# Patient Record
Sex: Female | Born: 1975 | Race: White | Hispanic: No | Marital: Married | Smoking: Never smoker
Health system: Southern US, Community
[De-identification: ages and names within clinical notes are randomized; demographics above are authoritative.]

## PROBLEM LIST (undated history)

## (undated) DIAGNOSIS — E785 Hyperlipidemia, unspecified: Secondary | ICD-10-CM

## (undated) DIAGNOSIS — D688 Other specified coagulation defects: Secondary | ICD-10-CM

## (undated) DIAGNOSIS — Z86718 Personal history of other venous thrombosis and embolism: Secondary | ICD-10-CM

## (undated) DIAGNOSIS — E039 Hypothyroidism, unspecified: Secondary | ICD-10-CM

## (undated) DIAGNOSIS — R002 Palpitations: Secondary | ICD-10-CM

## (undated) DIAGNOSIS — F419 Anxiety disorder, unspecified: Secondary | ICD-10-CM

## (undated) DIAGNOSIS — D6851 Activated protein C resistance: Secondary | ICD-10-CM

## (undated) DIAGNOSIS — R519 Headache, unspecified: Secondary | ICD-10-CM

## (undated) HISTORY — DX: Hypothyroidism, unspecified: E03.9

## (undated) HISTORY — DX: Headache, unspecified: R51.9

## (undated) HISTORY — DX: Hyperlipidemia, unspecified: E78.5

## (undated) HISTORY — DX: Anxiety disorder, unspecified: F41.9

## (undated) HISTORY — PX: LEG SURGERY: SHX1003

## (undated) HISTORY — PX: DILATION AND CURETTAGE OF UTERUS: SHX78

## (undated) HISTORY — DX: Palpitations: R00.2

## (undated) HISTORY — DX: Activated protein C resistance: D68.51

## (undated) HISTORY — DX: Personal history of other venous thrombosis and embolism: Z86.718

---

## 1998-08-28 ENCOUNTER — Other Ambulatory Visit: Admission: RE | Admit: 1998-08-28 | Discharge: 1998-08-28 | Payer: Self-pay | Admitting: Obstetrics and Gynecology

## 1999-09-03 ENCOUNTER — Other Ambulatory Visit: Admission: RE | Admit: 1999-09-03 | Discharge: 1999-09-03 | Payer: Self-pay | Admitting: Obstetrics and Gynecology

## 2000-01-02 ENCOUNTER — Other Ambulatory Visit: Admission: RE | Admit: 2000-01-02 | Discharge: 2000-01-02 | Payer: Self-pay | Admitting: Obstetrics and Gynecology

## 2000-02-26 ENCOUNTER — Encounter (INDEPENDENT_AMBULATORY_CARE_PROVIDER_SITE_OTHER): Payer: Self-pay | Admitting: Specialist

## 2000-02-26 ENCOUNTER — Other Ambulatory Visit: Admission: RE | Admit: 2000-02-26 | Discharge: 2000-02-26 | Payer: Self-pay | Admitting: Obstetrics and Gynecology

## 2000-07-29 ENCOUNTER — Emergency Department (HOSPITAL_COMMUNITY): Admission: EM | Admit: 2000-07-29 | Discharge: 2000-07-30 | Payer: Self-pay | Admitting: Emergency Medicine

## 2000-09-10 ENCOUNTER — Other Ambulatory Visit: Admission: RE | Admit: 2000-09-10 | Discharge: 2000-09-10 | Payer: Self-pay | Admitting: Obstetrics and Gynecology

## 2000-12-30 ENCOUNTER — Other Ambulatory Visit: Admission: RE | Admit: 2000-12-30 | Discharge: 2000-12-30 | Payer: Self-pay | Admitting: Obstetrics and Gynecology

## 2001-05-12 ENCOUNTER — Other Ambulatory Visit: Admission: RE | Admit: 2001-05-12 | Discharge: 2001-05-12 | Payer: Self-pay | Admitting: Obstetrics and Gynecology

## 2001-09-17 ENCOUNTER — Other Ambulatory Visit: Admission: RE | Admit: 2001-09-17 | Discharge: 2001-09-17 | Payer: Self-pay | Admitting: Obstetrics and Gynecology

## 2002-07-07 ENCOUNTER — Encounter: Admission: RE | Admit: 2002-07-07 | Discharge: 2002-07-07 | Payer: Self-pay | Admitting: Obstetrics and Gynecology

## 2002-07-07 ENCOUNTER — Encounter: Payer: Self-pay | Admitting: Obstetrics and Gynecology

## 2002-07-28 ENCOUNTER — Encounter: Payer: Self-pay | Admitting: Obstetrics and Gynecology

## 2002-07-28 ENCOUNTER — Encounter: Admission: RE | Admit: 2002-07-28 | Discharge: 2002-07-28 | Payer: Self-pay | Admitting: Obstetrics and Gynecology

## 2002-10-06 ENCOUNTER — Other Ambulatory Visit: Admission: RE | Admit: 2002-10-06 | Discharge: 2002-10-06 | Payer: Self-pay | Admitting: Obstetrics and Gynecology

## 2003-05-08 ENCOUNTER — Inpatient Hospital Stay (HOSPITAL_COMMUNITY): Admission: AD | Admit: 2003-05-08 | Discharge: 2003-05-08 | Payer: Self-pay | Admitting: Obstetrics and Gynecology

## 2003-05-08 ENCOUNTER — Encounter: Payer: Self-pay | Admitting: *Deleted

## 2003-05-10 ENCOUNTER — Inpatient Hospital Stay (HOSPITAL_COMMUNITY): Admission: AD | Admit: 2003-05-10 | Discharge: 2003-05-13 | Payer: Self-pay | Admitting: Obstetrics and Gynecology

## 2003-05-11 ENCOUNTER — Encounter (INDEPENDENT_AMBULATORY_CARE_PROVIDER_SITE_OTHER): Payer: Self-pay | Admitting: *Deleted

## 2003-06-21 ENCOUNTER — Other Ambulatory Visit: Admission: RE | Admit: 2003-06-21 | Discharge: 2003-06-21 | Payer: Self-pay | Admitting: Obstetrics and Gynecology

## 2006-01-22 ENCOUNTER — Ambulatory Visit (HOSPITAL_COMMUNITY): Admission: RE | Admit: 2006-01-22 | Discharge: 2006-01-22 | Payer: Self-pay | Admitting: Obstetrics and Gynecology

## 2006-01-22 ENCOUNTER — Encounter (INDEPENDENT_AMBULATORY_CARE_PROVIDER_SITE_OTHER): Payer: Self-pay | Admitting: Specialist

## 2006-10-06 ENCOUNTER — Ambulatory Visit (HOSPITAL_COMMUNITY): Admission: RE | Admit: 2006-10-06 | Discharge: 2006-10-06 | Payer: Self-pay | Admitting: Obstetrics and Gynecology

## 2006-10-08 ENCOUNTER — Ambulatory Visit: Payer: Self-pay | Admitting: Oncology

## 2006-10-09 ENCOUNTER — Ambulatory Visit: Payer: Self-pay | Admitting: Oncology

## 2006-10-14 LAB — CBC WITH DIFFERENTIAL (CANCER CENTER ONLY)
BASO#: 0 10*3/uL (ref 0.0–0.2)
BASO%: 0.5 % (ref 0.0–2.0)
EOS%: 1.8 % (ref 0.0–7.0)
HCT: 36.8 % (ref 34.8–46.6)
HGB: 12.7 g/dL (ref 11.6–15.9)
LYMPH#: 2.6 10*3/uL (ref 0.9–3.3)
LYMPH%: 36.9 % (ref 14.0–48.0)
MCH: 29.5 pg (ref 26.0–34.0)
MCHC: 34.4 g/dL (ref 32.0–36.0)
MCV: 86 fL (ref 81–101)
MONO%: 5 % (ref 0.0–13.0)
NEUT%: 55.8 % (ref 39.6–80.0)
RDW: 11.7 % (ref 10.5–14.6)

## 2006-10-21 LAB — COMPREHENSIVE METABOLIC PANEL
ALT: 15 U/L (ref 0–35)
AST: 20 U/L (ref 0–37)
Alkaline Phosphatase: 55 U/L (ref 39–117)
Creatinine, Ser: 0.78 mg/dL (ref 0.40–1.20)
Total Bilirubin: 0.7 mg/dL (ref 0.3–1.2)

## 2006-10-21 LAB — HYPERCOAGULABLE PANEL, COMPREHENSIVE
Anticardiolipin IgA: <7 [APL'U] (ref <13)
Anticardiolipin IgM: 14 [MPL'U] (ref <10)
Beta-2-Glycoprotein I IgM: <4 U/mL (ref <10)
DRVVT: 41.3 secs (ref 36.1–47.0)
Protein C, Total: 95 % (ref 70–140)

## 2006-10-21 LAB — LACTATE DEHYDROGENASE: LDH: 115 U/L (ref 94–250)

## 2006-11-26 ENCOUNTER — Ambulatory Visit (HOSPITAL_COMMUNITY): Admission: RE | Admit: 2006-11-26 | Discharge: 2006-11-26 | Payer: Self-pay | Admitting: Obstetrics and Gynecology

## 2006-12-18 ENCOUNTER — Ambulatory Visit: Payer: Self-pay | Admitting: Oncology

## 2006-12-30 LAB — CARDIOLIPIN ANTIBODIES, IGG, IGM, IGA
Anticardiolipin IgG: <7 [GPL'U] (ref <11)
Anticardiolipin IgM: 7 [MPL'U] (ref <10)

## 2006-12-30 LAB — BETA-2 GLYCOPROTEIN ANTIBODIES: Beta-2 Glyco I IgG: <4 U/mL (ref <20)

## 2009-01-03 ENCOUNTER — Ambulatory Visit: Payer: Self-pay | Admitting: Oncology

## 2009-02-02 ENCOUNTER — Ambulatory Visit: Payer: Self-pay | Admitting: Oncology

## 2009-02-06 LAB — COMPREHENSIVE METABOLIC PANEL
AST: 16 U/L (ref 0–37)
Alkaline Phosphatase: 61 U/L (ref 39–117)
BUN: 7 mg/dL (ref 6–23)
Glucose, Bld: 90 mg/dL (ref 70–99)
Total Bilirubin: 0.3 mg/dL (ref 0.3–1.2)

## 2009-02-06 LAB — PROTIME-INR (CHCC SATELLITE)
INR: 0.9 — ABNORMAL LOW (ref 2.0–3.5)
Protime: 10.8 Seconds (ref 10.6–13.4)

## 2009-02-06 LAB — IRON AND TIBC
%SAT: 19 % — ABNORMAL LOW (ref 20–55)
Iron: 89 ug/dL (ref 42–145)
TIBC: 468 ug/dL (ref 250–470)
UIBC: 379 ug/dL

## 2009-02-06 LAB — CBC WITH DIFFERENTIAL (CANCER CENTER ONLY)
BASO#: 0 10*3/uL (ref 0.0–0.2)
BASO%: 0.5 % (ref 0.0–2.0)
HCT: 27.6 % — ABNORMAL LOW (ref 34.8–46.6)
HGB: 9.8 g/dL — ABNORMAL LOW (ref 11.6–15.9)
LYMPH%: 25.1 % (ref 14.0–48.0)
MCV: 84 fL (ref 81–101)
MONO#: 0.5 10*3/uL (ref 0.1–0.9)
NEUT%: 66.2 % (ref 39.6–80.0)
RDW: 12.4 % (ref 10.5–14.6)
WBC: 7.4 10*3/uL (ref 3.9–10.0)

## 2009-02-06 LAB — HEPARIN ANTI-XA: Heparin LMW: 0.41 IU/mL

## 2009-03-24 ENCOUNTER — Ambulatory Visit: Payer: Self-pay | Admitting: Oncology

## 2009-03-29 LAB — CBC WITH DIFFERENTIAL (CANCER CENTER ONLY)
Eosinophils Absolute: 0.2 10*3/uL (ref 0.0–0.5)
HCT: 33.2 % — ABNORMAL LOW (ref 34.8–46.6)
LYMPH#: 2.2 10*3/uL (ref 0.9–3.3)
LYMPH%: 29.3 % (ref 14.0–48.0)
MCV: 89 fL (ref 81–101)
MONO#: 0.4 10*3/uL (ref 0.1–0.9)
NEUT%: 61.6 % (ref 39.6–80.0)
Platelets: 213 10*3/uL (ref 145–400)
RBC: 3.75 10*6/uL (ref 3.70–5.32)
WBC: 7.6 10*3/uL (ref 3.9–10.0)

## 2009-03-30 LAB — APTT: aPTT: 31 seconds (ref 24–37)

## 2009-04-12 ENCOUNTER — Inpatient Hospital Stay (HOSPITAL_COMMUNITY): Admission: AD | Admit: 2009-04-12 | Discharge: 2009-04-15 | Payer: Self-pay | Admitting: Obstetrics and Gynecology

## 2009-04-26 ENCOUNTER — Ambulatory Visit: Payer: Self-pay | Admitting: Oncology

## 2009-04-27 LAB — CBC WITH DIFFERENTIAL (CANCER CENTER ONLY)
Eosinophils Absolute: 0.2 10*3/uL (ref 0.0–0.5)
MONO#: 0.4 10*3/uL (ref 0.1–0.9)
NEUT#: 4.9 10*3/uL (ref 1.5–6.5)
Platelets: 311 10*3/uL (ref 145–400)
RBC: 4.47 10*6/uL (ref 3.70–5.32)
WBC: 8.9 10*3/uL (ref 3.9–10.0)

## 2009-06-23 ENCOUNTER — Ambulatory Visit: Payer: Self-pay | Admitting: Oncology

## 2009-06-28 LAB — CBC WITH DIFFERENTIAL (CANCER CENTER ONLY)
BASO%: 0.5 % (ref 0.0–2.0)
EOS%: 3.5 % (ref 0.0–7.0)
MCH: 29.1 pg (ref 26.0–34.0)
MCHC: 33.4 g/dL (ref 32.0–36.0)
MONO%: 5.3 % (ref 0.0–13.0)
NEUT#: 4.5 10*3/uL (ref 1.5–6.5)
Platelets: 264 10*3/uL (ref 145–400)
RBC: 4.39 10*6/uL (ref 3.70–5.32)

## 2009-06-28 LAB — HEPARIN ANTI-XA: Heparin LMW: 0.08 IU/mL

## 2010-03-30 ENCOUNTER — Encounter: Admission: RE | Admit: 2010-03-30 | Discharge: 2010-03-30 | Payer: Self-pay | Admitting: Family Medicine

## 2010-10-18 LAB — CBC
Hemoglobin: 10.8 g/dL — ABNORMAL LOW (ref 12.0–15.0)
MCHC: 34.1 g/dL (ref 30.0–36.0)
MCV: 90.9 fL (ref 78.0–100.0)
RBC: 3.49 MIL/uL — ABNORMAL LOW (ref 3.87–5.11)
RDW: 13.5 % (ref 11.5–15.5)

## 2010-10-18 LAB — CCBB MATERNAL DONOR DRAW

## 2010-10-19 LAB — CBC
HCT: 36.5 % (ref 36.0–46.0)
MCHC: 33.9 g/dL (ref 30.0–36.0)
MCHC: 34.7 g/dL (ref 30.0–36.0)
MCV: 89.5 fL (ref 78.0–100.0)
MCV: 89.8 fL (ref 78.0–100.0)
Platelets: 181 10*3/uL (ref 150–400)
Platelets: 190 10*3/uL (ref 150–400)
Platelets: 197 10*3/uL (ref 150–400)
RBC: 3.89 MIL/uL (ref 3.87–5.11)
RDW: 13.4 % (ref 11.5–15.5)
RDW: 13.9 % (ref 11.5–15.5)
WBC: 10.2 10*3/uL (ref 4.0–10.5)
WBC: 10.9 10*3/uL — ABNORMAL HIGH (ref 4.0–10.5)

## 2010-10-19 LAB — RPR: RPR Ser Ql: NONREACTIVE

## 2010-10-19 LAB — APTT: aPTT: 26 seconds (ref 24–37)

## 2010-11-30 NOTE — Op Note (Signed)
Samantha Hancock, Samantha Hancock              ACCOUNT NO.:  [id number]   MEDICAL RECORD NO.:  [id number]          PATIENT TYPE:  AMB   LOCATION:  SDC                           FACILITY:  [facility]   PHYSICIAN:  Maxie Better, M.D.DATE OF BIRTH:  [birth year]   DATE OF PROCEDURE:  01/22/2006  DATE OF DISCHARGE:                                 OPERATIVE REPORT   PREOPERATIVE DIAGNOSIS:  Missed abortion, vaginal bleeding.   POSTOPERATIVE DIAGNOSIS:  Missed abortion, vaginal bleeding.   PROCEDURE:  Suction dilation and evacuation.   ANESTHESIA:  MAC and paracervical block.   SURGEON:  Maxie Better, M.D.   DESCRIPTION OF PROCEDURE:  Under adequate monitored anesthesia, the patient  was placed in the dorsal lithotomy position.  She was sterilely prepped and  draped in the usual fashion.  The bladder was catheterized for a moderate  amount of urine.  The patient had been examined in the office today with a  uterus that was retroverted and no adnexal masses that could be appreciated.  A bivalve speculum was placed in the vagina.  20 mL of 1% Nesacaine was  injected paracervical at 3 and 9 o'clock.  The anterior lip of the cervix  was grasped with a single-tooth tenaculum.  The cervix easily accepted a #25  Pratt dilator.  A #7 mm curved suction cannula was introduced into the  uterine cavity.  Products of conception was obtained.  The cavity was  curetted and suctioned. When all tissue was felt to be removed, all  instruments were then removed from the vagina.  Specimen labeled products of  conception was sent to pathology.  Estimated blood loss was minimal.  Complication was none.  The patient tolerated the procedure well and was  transferred to the recovery room in stable condition.      Maxie Better, M.D.  Electronically Signed     [place]/MEDQ  D:  01/22/2006  T:  01/22/2006  Job:  [id number]

## 2011-01-31 ENCOUNTER — Emergency Department (HOSPITAL_COMMUNITY)
Admission: EM | Admit: 2011-01-31 | Discharge: 2011-02-01 | Disposition: A | Discharge status: Home / Self Care | Payer: BC Managed Care – PPO | Attending: Emergency Medicine | Admitting: Emergency Medicine

## 2011-01-31 DIAGNOSIS — E039 Hypothyroidism, unspecified: Secondary | ICD-10-CM | POA: Insufficient documentation

## 2011-01-31 DIAGNOSIS — D6859 Other primary thrombophilia: Secondary | ICD-10-CM | POA: Insufficient documentation

## 2011-01-31 DIAGNOSIS — R002 Palpitations: Secondary | ICD-10-CM | POA: Insufficient documentation

## 2011-01-31 DIAGNOSIS — R059 Cough, unspecified: Secondary | ICD-10-CM | POA: Insufficient documentation

## 2011-01-31 DIAGNOSIS — Z79899 Other long term (current) drug therapy: Secondary | ICD-10-CM | POA: Insufficient documentation

## 2011-01-31 DIAGNOSIS — R05 Cough: Secondary | ICD-10-CM | POA: Insufficient documentation

## 2011-02-01 ENCOUNTER — Emergency Department (HOSPITAL_COMMUNITY): Payer: BC Managed Care – PPO

## 2011-02-01 LAB — DIFFERENTIAL
Basophils Absolute: 0.1 10*3/uL (ref 0.0–0.1)
Eosinophils Relative: 3 % (ref 0–5)
Lymphocytes Relative: 52 % — ABNORMAL HIGH (ref 12–46)
Lymphs Abs: 3.6 10*3/uL (ref 0.7–4.0)
Monocytes Relative: 7 % (ref 3–12)
Neutrophils Relative %: 36 % — ABNORMAL LOW (ref 43–77)

## 2011-02-01 LAB — COMPREHENSIVE METABOLIC PANEL
ALT: 18 U/L (ref 0–35)
Albumin: 3.3 g/dL — ABNORMAL LOW (ref 3.5–5.2)
BUN: 11 mg/dL (ref 6–23)
Calcium: 9.6 mg/dL (ref 8.4–10.5)
GFR calc Af Amer: >60 mL/min (ref >60)
Glucose, Bld: 95 mg/dL (ref 70–99)
Potassium: 3.6 mEq/L (ref 3.5–5.1)
Sodium: 139 mEq/L (ref 135–145)
Total Protein: 6.8 g/dL (ref 6.0–8.3)

## 2011-02-01 LAB — CBC
HCT: 32.8 % — ABNORMAL LOW (ref 36.0–46.0)
Hemoglobin: 11.5 g/dL — ABNORMAL LOW (ref 12.0–15.0)
MCHC: 35.1 g/dL (ref 30.0–36.0)
RBC: 4.11 MIL/uL (ref 3.87–5.11)
WBC: 6.9 10*3/uL (ref 4.0–10.5)

## 2011-02-01 LAB — CK TOTAL AND CKMB (NOT AT ARMC)
CK, MB: 1.8 ng/mL (ref 0.3–4.0)
Relative Index: INVALID (ref 0.0–2.5)
Total CK: 71 U/L (ref 7–177)

## 2015-08-12 ENCOUNTER — Encounter (HOSPITAL_BASED_OUTPATIENT_CLINIC_OR_DEPARTMENT_OTHER): Payer: Self-pay | Admitting: *Deleted

## 2015-08-12 ENCOUNTER — Emergency Department (HOSPITAL_BASED_OUTPATIENT_CLINIC_OR_DEPARTMENT_OTHER)
Admission: EM | Admit: 2015-08-12 | Discharge: 2015-08-12 | Disposition: A | Discharge status: Home / Self Care | Payer: 59 | Attending: Emergency Medicine | Admitting: Emergency Medicine

## 2015-08-12 DIAGNOSIS — Z79899 Other long term (current) drug therapy: Secondary | ICD-10-CM | POA: Insufficient documentation

## 2015-08-12 DIAGNOSIS — Z880 Allergy status to penicillin: Secondary | ICD-10-CM | POA: Insufficient documentation

## 2015-08-12 DIAGNOSIS — K1120 Sialoadenitis, unspecified: Secondary | ICD-10-CM | POA: Diagnosis not present

## 2015-08-12 DIAGNOSIS — E039 Hypothyroidism, unspecified: Secondary | ICD-10-CM | POA: Insufficient documentation

## 2015-08-12 DIAGNOSIS — R220 Localized swelling, mass and lump, head: Secondary | ICD-10-CM | POA: Diagnosis present

## 2015-08-12 DIAGNOSIS — Z862 Personal history of diseases of the blood and blood-forming organs and certain disorders involving the immune mechanism: Secondary | ICD-10-CM | POA: Diagnosis not present

## 2015-08-12 HISTORY — DX: Other specified coagulation defects: D68.8

## 2015-08-12 HISTORY — DX: Hypothyroidism, unspecified: E03.9

## 2015-08-12 LAB — CBC WITH DIFFERENTIAL/PLATELET
BASOS ABS: 0 10*3/uL (ref 0.0–0.1)
BASOS PCT: 0 %
EOS ABS: 0.1 10*3/uL (ref 0.0–0.7)
Eosinophils Relative: 2 %
HEMATOCRIT: 35.5 % — AB (ref 36.0–46.0)
HEMOGLOBIN: 11.9 g/dL — AB (ref 12.0–15.0)
Lymphocytes Relative: 26 %
Lymphs Abs: 1.9 10*3/uL (ref 0.7–4.0)
MCH: 28.6 pg (ref 26.0–34.0)
MCHC: 33.5 g/dL (ref 30.0–36.0)
MCV: 85.3 fL (ref 78.0–100.0)
Monocytes Absolute: 1 10*3/uL (ref 0.1–1.0)
Monocytes Relative: 14 %
NEUTROS ABS: 4.1 10*3/uL (ref 1.7–7.7)
NEUTROS PCT: 58 %
Platelets: 185 10*3/uL (ref 150–400)
RBC: 4.16 MIL/uL (ref 3.87–5.11)
RDW: 12.1 % (ref 11.5–15.5)
WBC: 7.2 10*3/uL (ref 4.0–10.5)

## 2015-08-12 LAB — BASIC METABOLIC PANEL
ANION GAP: 7 (ref 5–15)
BUN: 17 mg/dL (ref 6–20)
CALCIUM: 8.9 mg/dL (ref 8.9–10.3)
CO2: 27 mmol/L (ref 22–32)
CREATININE: 0.91 mg/dL (ref 0.44–1.00)
Chloride: 105 mmol/L (ref 101–111)
Glucose, Bld: 109 mg/dL — ABNORMAL HIGH (ref 65–99)
Potassium: 3.7 mmol/L (ref 3.5–5.1)
SODIUM: 139 mmol/L (ref 135–145)

## 2015-08-12 MED ORDER — AMOXICILLIN-POT CLAVULANATE 875-125 MG PO TABS: 1.0000 | ORAL_TABLET | Freq: Two times a day (BID) | ORAL | Status: DC

## 2015-08-12 MED ORDER — CLINDAMYCIN PHOSPHATE 600 MG/50ML IV SOLN
600.0000 mg | Freq: Once | INTRAVENOUS | Status: AC
  Administered 2015-08-12: 600 mg via INTRAVENOUS
  Filled 2015-08-12: qty 50

## 2015-08-12 NOTE — Discharge Instructions (Signed)
Please take your antibiotics as prescribed. Do not save or share them. Discontinue your other antibiotics. Follow-up with your doctor in 2 days for a wound recheck. Return to ED for any new or worsening symptoms as we discussed.  Parotitis Parotitis is soreness and inflammation of one or both parotid glands. The parotid glands produce saliva. They are located on each side of the face, below and in front of the earlobes. The saliva produced comes out of tiny openings (ducts) inside the cheeks. In most cases, parotitis goes away over time or with treatment. If your parotitis is caused by certain long-term (chronic) diseases, it may come back again.  CAUSES  Parotitis can be caused by:  Viral infections. Mumps is one viral infection that can cause parotitis.  Bacterial infections.  Blockage of the salivary ducts due to a salivary stone.  Narrowing of the salivary ducts.  Swelling of the salivary ducts.  Dehydration.  Autoimmune conditions, such as sarcoidosis or Sjogren syndrome.  Air from activities such as scuba diving, glass blowing, or playing an instrument (rare).  Human immunodeficiency virus (HIV) or acquired immunodeficiency syndrome (AIDS).  Tuberculosis. SIGNS AND SYMPTOMS   The ears may appear to be pushed up and out from their normal position.  Redness (erythema) of the skin over the parotid glands.  Pain and tenderness over the parotid glands.  Swelling in the parotid gland area.  Yellowish-white fluid (pus) coming from the ducts inside the cheeks.  Dry mouth.  Bad taste in the mouth. DIAGNOSIS  Your health care provider may determine that you have parotitis based on your symptoms and a physical exam. A sample of fluid may also be taken from the parotid gland and tested to find the cause of your infection. X-rays or computed tomography (CT) scans may be taken if your health care provider thinks you might have a salivary stone blocking your salivary duct. TREATMENT   Treatment varies depending upon the cause of your parotitis. If your parotitis is caused by mumps, no treatment is needed. The condition will go away on its own after 7 to 10 days. In other cases, treatment may include:  Antibiotic medicine if your infection was caused by bacteria.  Pain medicines.  Gland massage.  Eating sour candy to increase your saliva production.  Removal of salivary stones. Your health care provider may flush stones out with fluids or remove them with tweezers.  Surgery to remove the parotid glands. HOME CARE INSTRUCTIONS   If you were prescribed an antibiotic medicine, finish it all even if you start to feel better.  Put warm compresses on the sore area.  Take medicines only as directed by your health care provider.  Drink enough fluids to keep your urine clear or pale yellow. SEEK IMMEDIATE MEDICAL CARE IF:   You have increasing pain or swelling that is not controlled with medicine.  You have a fever. MAKE SURE YOU:  Understand these instructions.  Will watch your condition.  Will get help right away if you are not doing well or get worse.   This information is not intended to replace advice given to you by your health care provider. Make sure you discuss any questions you have with your health care provider.   Document Released: 12/21/2001 Document Revised: 07/22/2014 Document Reviewed: 11/24/2014 Elsevier Interactive Patient Education [employer].

## 2015-08-12 NOTE — ED Notes (Signed)
MD at bedside. 

## 2015-08-12 NOTE — ED Provider Notes (Signed)
CSN: [id number]     Arrival date & time 08/12/15  1717 History   First MD Initiated Contact with Patient 08/12/15 1750     Chief Complaint  Patient presents with  . Facial Swelling     (Consider location/radiation/quality/duration/timing/severity/associated sxs/prior Treatment) HPI Samantha Hancock is a 40 y.o. female who comes in for evaluation of facial swelling. Patient reports she has had an area on the left side of her cheek that has been increasingly red, swollen and somewhat tender. She was evaluated by her PCPs office 2 days ago and told it wasn't infected salivary gland and started on clindamycin. She has had 4 doses of her 400 mg clindamycin since that time. She is concerned because she feels her symptoms may be worsening. She was evaluated her PCPs office again today and referred to the ED for further evaluation. She denies any fevers, chills, sore throat, changes in voice, ear pain, difficulties opening her jaw or chewing.  Past Medical History  Diagnosis Date  . 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 Hypothyroid    Past Surgical History  Procedure Laterality Date  . Dilation and curettage of uterus    . Leg surgery      left leg   History reviewed. No pertinent family history. Social History  Substance Use Topics  . Smoking status: Never Smoker   . Smokeless tobacco: None  . Alcohol Use: Yes   OB History    No data available     Review of Systems A 10 point review of systems was completed and was negative except for pertinent positives and negatives as mentioned in the history of present illness     Allergies  Amoxil  Home Medications   Prior to Admission medications   Medication Sig Start Date End Date Taking? Authorizing Provider  levothyroxine (SYNTHROID, LEVOTHROID) 88 MCG tablet Take 88 mcg by mouth daily before breakfast.   Yes Historical Provider, MD  sertraline (ZOLOFT) 25 MG tablet Take 25 mg by mouth daily.   Yes  Historical Provider, MD  amoxicillin-clavulanate (AUGMENTIN) 875-125 MG tablet Take 1 tablet by mouth every 12 (twelve) hours. 08/12/15   Nuvia Hileman, PA-C   BP 120/86 mmHg  Pulse 64  Temp(Src) 98.5 F (36.9 C) (Oral)  Resp 16  Ht 5' 7.5" (1.715 m)  Wt 61.689 kg  BMI 20.97 kg/m2  SpO2 100%  LMP 08/06/2015 Physical Exam  Constitutional: She is oriented to person, place, and time. She appears well-developed and well-nourished.  HENT:  Head: Normocephalic and atraumatic.  Mouth/Throat: Oropharynx is clear and moist.  There is area of erythema and tenderness located in the area of the left parotid. There is surrounding localized lymphadenitis. No auricular lymphadenopathy or mastoid tenderness. No trismus or oral lesions.  Eyes: Conjunctivae are normal. Pupils are equal, round, and reactive to light. Right eye exhibits no discharge. Left eye exhibits no discharge. No scleral icterus.  Neck: Neck supple.  Cardiovascular: Normal rate, regular rhythm and normal heart sounds.   Pulmonary/Chest: Effort normal and breath sounds normal. No respiratory distress. She has no wheezes. She has no rales.  Abdominal: Soft. There is no tenderness.  Musculoskeletal: She exhibits no tenderness.  Neurological: She is alert and oriented to person, place, and time.  Cranial Nerves II-XII grossly intact  Skin: Skin is warm and dry. No rash noted.  Psychiatric: She has a normal mood and affect.  Nursing note and vitals reviewed.   ED Course  Procedures (  including critical care time) Labs Review Labs Reviewed  BASIC METABOLIC PANEL - Abnormal; Notable for the following:    Glucose, Bld 109 (*)    All other components within normal limits  CBC WITH DIFFERENTIAL/PLATELET - Abnormal; Notable for the following:    Hemoglobin 11.9 (*)    HCT 35.5 (*)    All other components within normal limits    Imaging Review No results found. I have personally reviewed and evaluated these images and lab  results as part of my medical decision-making.   EKG Interpretation None     Meds given in ED:  Medications  clindamycin (CLEOCIN) IVPB 600 mg (0 mg Intravenous Stopped 08/12/15 1854)    Discharge Medication List as of 08/12/2015  7:34 PM    START taking these medications   Details  amoxicillin-clavulanate (AUGMENTIN) 875-125 MG tablet Take 1 tablet by mouth every 12 (twelve) hours., Starting 08/12/2015, Until Discontinued, Print       Filed Vitals:   08/12/15 1721 08/12/15 1941  BP: 114/71 120/86  Pulse: 84 64  Temp: 98.5 F (36.9 C)   TempSrc: Oral   Resp: 16 16  Height: 5' 7.5" (1.715 m)   Weight: 61.689 kg   SpO2: 100% 100%    MDM  Samantha Hancock is a 40 y.o. female presents for evaluation of left-sided cheek pain, swelling, redness. Exam is consistent with parotitis. No evidence of PTA, Ludwig angina or other deep space infection at this time. However, we'll obtain basic labs, given IV dose of clindamycin in the ED. labs are unremarkable, no leukocytosis.  Overall, patient appears well, nontoxic, hemodynamically stable and afebrile. She is maintaining her airway without difficulty with normal speech. Labs are grossly unremarkable, no leukocytosis. Will plan on changing patient's antibiotic therapy to Augmentin. Discussed she will need a recheck in 2 days. Verbalizes understanding and agrees with this plan. Prior to patient discharge, I discussed and reviewed this case with Dr. Canary Brim    Final diagnoses:  Parotitis        Comer Locket, PA-C 08/13/15 [place], MD 08/16/15 [phone]

## 2015-08-12 NOTE — ED Notes (Signed)
Patient is alert and oriented x4.  She is complaining of facial swelling that started 2 days ago.   Patient has been seen by her PCP and was put on antibiotics.  Today she is complaining of  A different feeling in her left facial area with more warmth in her face.  She denies pain but  Does have some itching.

## 2016-08-11 DIAGNOSIS — Z23 Encounter for immunization: Secondary | ICD-10-CM | POA: Diagnosis not present

## 2016-08-28 DIAGNOSIS — D1801 Hemangioma of skin and subcutaneous tissue: Secondary | ICD-10-CM | POA: Diagnosis not present

## 2016-08-28 DIAGNOSIS — D2272 Melanocytic nevi of left lower limb, including hip: Secondary | ICD-10-CM | POA: Diagnosis not present

## 2016-08-28 DIAGNOSIS — D2271 Melanocytic nevi of right lower limb, including hip: Secondary | ICD-10-CM | POA: Diagnosis not present

## 2016-08-28 DIAGNOSIS — D225 Melanocytic nevi of trunk: Secondary | ICD-10-CM | POA: Diagnosis not present

## 2016-08-28 DIAGNOSIS — D485 Neoplasm of uncertain behavior of skin: Secondary | ICD-10-CM | POA: Diagnosis not present

## 2016-09-04 DIAGNOSIS — Z6823 Body mass index (BMI) 23.0-23.9, adult: Secondary | ICD-10-CM | POA: Diagnosis not present

## 2016-09-04 DIAGNOSIS — Z01419 Encounter for gynecological examination (general) (routine) without abnormal findings: Secondary | ICD-10-CM | POA: Diagnosis not present

## 2016-11-05 DIAGNOSIS — E039 Hypothyroidism, unspecified: Secondary | ICD-10-CM | POA: Diagnosis not present

## 2016-11-05 DIAGNOSIS — E559 Vitamin D deficiency, unspecified: Secondary | ICD-10-CM | POA: Diagnosis not present

## 2016-11-05 DIAGNOSIS — Z0000 Encounter for general adult medical examination without abnormal findings: Secondary | ICD-10-CM | POA: Diagnosis not present

## 2016-11-12 DIAGNOSIS — D6851 Activated protein C resistance: Secondary | ICD-10-CM | POA: Diagnosis not present

## 2016-11-12 DIAGNOSIS — E039 Hypothyroidism, unspecified: Secondary | ICD-10-CM | POA: Diagnosis not present

## 2016-11-12 DIAGNOSIS — Z0000 Encounter for general adult medical examination without abnormal findings: Secondary | ICD-10-CM | POA: Diagnosis not present

## 2016-12-23 DIAGNOSIS — E039 Hypothyroidism, unspecified: Secondary | ICD-10-CM | POA: Diagnosis not present

## 2017-09-16 DIAGNOSIS — Z01419 Encounter for gynecological examination (general) (routine) without abnormal findings: Secondary | ICD-10-CM | POA: Diagnosis not present

## 2017-09-16 DIAGNOSIS — Z6824 Body mass index (BMI) 24.0-24.9, adult: Secondary | ICD-10-CM | POA: Diagnosis not present

## 2017-11-14 DIAGNOSIS — E039 Hypothyroidism, unspecified: Secondary | ICD-10-CM | POA: Diagnosis not present

## 2017-11-14 DIAGNOSIS — Z0000 Encounter for general adult medical examination without abnormal findings: Secondary | ICD-10-CM | POA: Diagnosis not present

## 2017-11-25 DIAGNOSIS — Z0000 Encounter for general adult medical examination without abnormal findings: Secondary | ICD-10-CM | POA: Diagnosis not present

## 2017-11-25 DIAGNOSIS — D6851 Activated protein C resistance: Secondary | ICD-10-CM | POA: Diagnosis not present

## 2017-11-25 DIAGNOSIS — E039 Hypothyroidism, unspecified: Secondary | ICD-10-CM | POA: Diagnosis not present

## 2018-06-04 DIAGNOSIS — Z800 Family history of malignant neoplasm of digestive organs: Secondary | ICD-10-CM | POA: Diagnosis not present

## 2018-06-04 DIAGNOSIS — Z23 Encounter for immunization: Secondary | ICD-10-CM | POA: Diagnosis not present

## 2018-06-04 DIAGNOSIS — R002 Palpitations: Secondary | ICD-10-CM | POA: Diagnosis not present

## 2018-06-08 DIAGNOSIS — D2262 Melanocytic nevi of left upper limb, including shoulder: Secondary | ICD-10-CM | POA: Diagnosis not present

## 2018-06-08 DIAGNOSIS — D2261 Melanocytic nevi of right upper limb, including shoulder: Secondary | ICD-10-CM | POA: Diagnosis not present

## 2018-06-08 DIAGNOSIS — D1801 Hemangioma of skin and subcutaneous tissue: Secondary | ICD-10-CM | POA: Diagnosis not present

## 2018-06-30 DIAGNOSIS — R002 Palpitations: Secondary | ICD-10-CM | POA: Diagnosis not present

## 2018-07-14 DIAGNOSIS — R002 Palpitations: Secondary | ICD-10-CM | POA: Diagnosis not present

## 2018-07-23 ENCOUNTER — Ambulatory Visit: Payer: 59 | Admitting: Cardiology

## 2018-10-09 ENCOUNTER — Ambulatory Visit: Payer: 59 | Admitting: Internal Medicine

## 2018-11-19 ENCOUNTER — Telehealth: Payer: Self-pay | Admitting: Internal Medicine

## 2018-11-19 NOTE — Telephone Encounter (Signed)
Pt. Does have smart phone.

## 2018-11-19 NOTE — Telephone Encounter (Signed)
Virtual Visit Pre-Appointment Phone Call  "(Name), I am calling you today to discuss your upcoming appointment. We are currently trying to limit exposure to the virus that causes COVID-19 by seeing patients at home rather than in the office."  1. "What is the BEST phone number to call the day of the visit?" - include this in appointment notes  2. Do you have or have access to (through a family member/friend) a smartphone with video capability that we can use for your visit?" a. If yes - list this number in appt notes as cell (if different from BEST phone #) and list the appointment type as a VIDEO visit in appointment notes b. If no - list the appointment type as a PHONE visit in appointment notes  3. Confirm consent - "In the setting of the current Covid19 crisis, you are scheduled for a (phone or video) visit with your provider on (date) at (time).  Just as we do with many in-office visits, in order for you to participate in this visit, we must obtain consent.  If you'd like, I can send this to your mychart (if signed up) or email for you to review.  Otherwise, I can obtain your verbal consent now.  All virtual visits are billed to your insurance company just like a normal visit would be.  By agreeing to a virtual visit, we'd like you to understand that the technology does not allow for your provider to perform an examination, and thus may limit your provider's ability to fully assess your condition. If your provider identifies any concerns that need to be evaluated in person, we will make arrangements to do so.  Finally, though the technology is pretty good, we cannot assure that it will always work on either your or our end, and in the setting of a video visit, we may have to convert it to a phone-only visit.  In either situation, we cannot ensure that we have a secure connection.  Are you willing to proceed?" YES  4. Advise patient to be prepared - "Two hours prior to your appointment, go  ahead and check your blood pressure, pulse, oxygen saturation, and your weight (if you have the equipment to check those) and write them all down. When your visit starts, your provider will ask you for this information. If you have an Apple Watch or Kardia device, please plan to have heart rate information ready on the day of your appointment. Please have a pen and paper handy nearby the day of the visit as well."  5. Give patient instructions for MyChart download to smartphone OR Doximity/Doxy.me as below if video visit (depending on what platform provider is using)  6. Inform patient they will receive a phone call 15 minutes prior to their appointment time (may be from unknown caller ID) so they should be prepared to answer    TELEPHONE CALL NOTE  Samantha Hancock has been deemed a candidate for a follow-up tele-health visit to limit community exposure during the Covid-19 pandemic. I spoke with the patient via phone to ensure availability of phone/video source, confirm preferred email & phone number, and discuss instructions and expectations.  I reminded HILDA RYNDERS to be prepared with any vital sign and/or heart rhythm information that could potentially be obtained via home monitoring, at the time of her visit. I reminded ANGALENA COUSINEAU to expect a phone call prior to her visit.  Minus Liberty 11/19/2018 2:51 PM   INSTRUCTIONS FOR DOWNLOADING  THE MYCHART APP TO SMARTPHONE  - The patient must first make sure to have activated MyChart and know their login information - If Apple, go to [employer] and type in MyChart in the search bar and download the app. If Android, ask patient to go to [employer] and type in [place] in the search bar and download the app. The app is free but as with any other app downloads, their phone may require them to verify saved payment information or Apple/Android password.  - The patient will need to then log into the app with their MyChart username and  password, and select [facility] as their healthcare provider to link the account. When it is time for your visit, go to the MyChart app, find appointments, and click Begin Video Visit. Be sure to Select Allow for your device to access the Microphone and Camera for your visit. You will then be connected, and your provider will be with you shortly.  **If they have any issues connecting, or need assistance please contact MyChart service desk [phone])**  **If using a computer, in order to ensure the best quality for their visit they will need to use either of the following Internet Browsers: [employer], or Google Chrome**  IF USING DOXIMITY or DOXY.ME - The patient will receive a link just prior to their visit by text.     FULL LENGTH CONSENT FOR TELE-HEALTH VISIT   I hereby voluntarily request, consent and authorize [facility] and its employed or contracted physicians, physician assistants, nurse practitioners or other licensed health care professionals (the Practitioner), to provide me with telemedicine health care services (the Services") as deemed necessary by the treating Practitioner. I acknowledge and consent to receive the Services by the Practitioner via telemedicine. I understand that the telemedicine visit will involve communicating with the Practitioner through live audiovisual communication technology and the disclosure of certain medical information by electronic transmission. I acknowledge that I have been given the opportunity to request an in-person assessment or other available alternative prior to the telemedicine visit and am voluntarily participating in the telemedicine visit.  I understand that I have the right to withhold or withdraw my consent to the use of telemedicine in the course of my care at any time, without affecting my right to future care or treatment, and that the Practitioner or I may terminate the telemedicine visit at any time. I  understand that I have the right to inspect all information obtained and/or recorded in the course of the telemedicine visit and may receive copies of available information for a reasonable fee.  I understand that some of the potential risks of receiving the Services via telemedicine include:   Delay or interruption in medical evaluation due to technological equipment failure or disruption;  Information transmitted may not be sufficient (e.g. poor resolution of images) to allow for appropriate medical decision making by the Practitioner; and/or   In rare instances, security protocols could fail, causing a breach of personal health information.  Furthermore, I acknowledge that it is my responsibility to provide information about my medical history, conditions and care that is complete and accurate to the best of my ability. I acknowledge that Practitioner's advice, recommendations, and/or decision may be based on factors not within their control, such as incomplete or inaccurate data provided by me or distortions of diagnostic images or specimens that may result from electronic transmissions. I understand that the practice of medicine is not an exact science and that Practitioner  makes no warranties or guarantees regarding treatment outcomes. I acknowledge that I will receive a copy of this consent concurrently upon execution via email to the email address I last provided but may also request a printed copy by calling the office of [facility].    I understand that my insurance will be billed for this visit.   I have read or had this consent read to me.  I understand the contents of this consent, which adequately explains the benefits and risks of the Services being provided via telemedicine.   I have been provided ample opportunity to ask questions regarding this consent and the Services and have had my questions answered to my satisfaction.  I give my informed consent for the services to be  provided through the use of telemedicine in my medical care  By participating in this telemedicine visit I agree to the above.

## 2018-11-20 ENCOUNTER — Telehealth (INDEPENDENT_AMBULATORY_CARE_PROVIDER_SITE_OTHER): Payer: Self-pay | Admitting: Internal Medicine

## 2018-11-20 ENCOUNTER — Encounter: Payer: Self-pay | Admitting: Internal Medicine

## 2018-11-20 ENCOUNTER — Other Ambulatory Visit: Payer: Self-pay

## 2018-11-20 VITALS — Ht 67.5 in | Wt 143.4 lb

## 2018-11-20 DIAGNOSIS — R002 Palpitations: Secondary | ICD-10-CM

## 2018-11-20 NOTE — Progress Notes (Signed)
Virtual Visit via Video Note   This visit type was conducted due to national recommendations for restrictions regarding the COVID-19 Pandemic (e.g. social distancing) in an effort to limit this patient's exposure and mitigate transmission in our community.  Due to her co-morbid illnesses, this patient is at least at moderate risk for complications without adequate follow up.  This format is felt to be most appropriate for this patient at this time.  All issues noted in this document were discussed and addressed.  A limited physical exam was performed with this format.  Please refer to the patient's chart for her consent to telehealth for [facility].   Date:  11/20/2018   ID:  Samantha Hancock, DOB 1976/01/30, MRN [id number]  Patient Location: Home Provider Location: Home  PCP:  Deland Pretty, MD  Cardiologist:  Glenetta Hew, MD  Electrophysiologist:  None   Evaluation Performed:  Consultation - Rivka Safer was referred by DR Shelia Media for the evaluation of palpitatoins  .  Chief Complaint:  Palpitaitons    History of Present Illness:    Samantha Hancock is a 43 y.o. female with a history of palptiations    Pt has sensed them  Since her  69s Intermittent   Occur while she is  active     Has seen a cardiologist in the past    When feels heart jump like fish jumping in chest    One time she took a Z pac   Had BAD spell, worst ever   Stopped taking  Went away  ONe time occurred like she was "pinned to chair"  SOmetimes she feels a little crampy in her chest   Radiates to her L arm .   ? litte weak   Occur attimes of high stres  Pt says that at other times she feels good  Concerned   Wants to get her heart looked at [facility] with heart attack   The patient /does not} have symptoms concerning for COVID-19 infection (fever, chills, cough, or new shortness of breath).    Past Medical History:  Diagnosis Date  . 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  Hypothyroid   . Palpitations    Past Surgical History:  Procedure Laterality Date  . DILATION AND CURETTAGE OF UTERUS    . LEG SURGERY     left leg     Current Meds  Medication Sig  . levothyroxine (SYNTHROID) 100 MCG tablet Take 100 mcg by mouth daily.  . Multiple Vitamin (MULTIVITAMIN) tablet Take 1 tablet by mouth daily.  Marland Kitchen OVER THE COUNTER MEDICATION Beta Glucan 1 capsule daily  . OVER THE COUNTER MEDICATION Vita mineral powder greens. Green super foods  . OVER THE COUNTER MEDICATION Cologen powder  . OVER THE COUNTER MEDICATION Vitamin d3 gummies     Allergies:   Amoxil [amoxicillin] and Penicillins   Social History   Tobacco Use  . Smoking status: Never Smoker  . Smokeless tobacco: Never Used  Substance Use Topics  . Alcohol use: Yes  . Drug use: Never     Family Hx: The patient's family history includes Aortic aneurysm in her maternal aunt and maternal grandmother; Other in her mother; Pancreatic cancer in her mother. Paternal GF MI at 66   ROS:   Please see the history of present illness.    All other systems reviewed and are negative.   Prior CV studies:   The following studies were reviewed today: None  available    Labs/Other Tests and Data Reviewed:    EKG: EKG is not done as tele visit From 05/2018  SR    Recent Labs: No results found for requested labs within last 8760 hours.   Recent Lipid Panel No results found for: CHOL, TRIG, HDL, CHOLHDL, LDLCALC, LDLDIRECT  Wt Readings from Last 3 Encounters:  11/20/18 143 lb 6.4 oz (65 kg)  08/12/15 136 lb (61.7 kg)     Objective:    Vital Signs:  Ht 5' 7.5" (1.715 m)   Wt 143 lb 6.4 oz (65 kg)   BMI 22.13 kg/m    Exam not done as televisit   ASSESSMENT & PLAN:    1. Palpitations   Palpitations sound like isolated PVCs.   COncerning is episode after Zpac.     PRevious EKG form Novemer 2019 QT interval is normal      I have recomm that pt have an ecohcardiologram to eval chamber size, RV   I  would also recomm another EKG when she comes in for this test    SInce spells are infreq would be hard to get on an event monitor   I have encouraged her to consider an Apple watch    COVID-19 Education: The signs and symptoms of COVID-19 were discussed with the patient and how to seek care for testing (follow up with PCP or arrange E-visit).  The importance of social distancing was discussed today.  Time:   Today, I have spent 20 minutes with the patient with telehealth technology discussing the above problems.     Medication Adjustments/Labs and Tests Ordered: Current medicines are reviewed at length with the patient today.  Concerns regarding medicines are outlined above.   Tests Ordered: No orders of the defined types were placed in this encounter.   Medication Changes: No orders of the defined types were placed in this encounter.   Disposition:  Follow up based on test results  Signed, Dorris Carnes, MD  11/20/2018 12:19 PM    [place]

## 2018-11-20 NOTE — Patient Instructions (Signed)
Medication Instructions:  No changes made If you need a refill on your cardiac medications before your next appointment, please call your pharmacy.   Lab work: none If you have labs (blood work) drawn today and your tests are completely normal, you will receive your results only by: Marland Kitchen MyChart Message (if you have MyChart) OR . A paper copy in the mail If you have any lab test that is abnormal or we need to change your treatment, we will call you to review the results.  Testing/Procedures: Your physician has requested that you have an echocardiogram. Echocardiography is a painless test that uses sound waves to create images of your heart. It provides your doctor with information about the size and shape of your heart and how well your heart's chambers and valves are working. This procedure takes approximately one hour. There are no restrictions for this procedure.  On the day you come for echo, we will plan to get an EKG also.   Follow-Up: Follow up with your physician will depend on test results.  Any Other Special Instructions Will Be Listed Below (If Applicable). We will plan for  EKG same day ask ECHO

## 2018-12-04 ENCOUNTER — Telehealth: Payer: Self-pay | Admitting: Internal Medicine

## 2018-12-04 ENCOUNTER — Encounter (HOSPITAL_COMMUNITY): Payer: Self-pay | Admitting: Emergency Medicine

## 2018-12-04 ENCOUNTER — Emergency Department (HOSPITAL_COMMUNITY): Payer: Commercial Managed Care - PPO

## 2018-12-04 ENCOUNTER — Other Ambulatory Visit: Payer: Self-pay

## 2018-12-04 ENCOUNTER — Ambulatory Visit: Payer: 59 | Admitting: Internal Medicine

## 2018-12-04 ENCOUNTER — Emergency Department (HOSPITAL_COMMUNITY)
Admission: EM | Admit: 2018-12-04 | Discharge: 2018-12-04 | Disposition: A | Discharge status: Home / Self Care | Payer: Commercial Managed Care - PPO | Attending: Emergency Medicine | Admitting: Emergency Medicine

## 2018-12-04 DIAGNOSIS — R0789 Other chest pain: Secondary | ICD-10-CM | POA: Diagnosis not present

## 2018-12-04 DIAGNOSIS — R110 Nausea: Secondary | ICD-10-CM | POA: Insufficient documentation

## 2018-12-04 DIAGNOSIS — Z79899 Other long term (current) drug therapy: Secondary | ICD-10-CM | POA: Insufficient documentation

## 2018-12-04 DIAGNOSIS — E039 Hypothyroidism, unspecified: Secondary | ICD-10-CM | POA: Diagnosis not present

## 2018-12-04 DIAGNOSIS — R079 Chest pain, unspecified: Secondary | ICD-10-CM | POA: Diagnosis present

## 2018-12-04 LAB — CBC
HCT: 37.8 % (ref 36.0–46.0)
Hemoglobin: 12.8 g/dL (ref 12.0–15.0)
MCH: 29.2 pg (ref 26.0–34.0)
MCHC: 33.9 g/dL (ref 30.0–36.0)
MCV: 86.1 fL (ref 80.0–100.0)
Platelets: 206 10*3/uL (ref 150–400)
RBC: 4.39 MIL/uL (ref 3.87–5.11)
RDW: 11.9 % (ref 11.5–15.5)
WBC: 6.7 10*3/uL (ref 4.0–10.5)
nRBC: 0 % (ref 0.0–0.2)

## 2018-12-04 LAB — BASIC METABOLIC PANEL
Anion gap: 10 (ref 5–15)
BUN: 10 mg/dL (ref 6–20)
CO2: 22 mmol/L (ref 22–32)
Calcium: 9.5 mg/dL (ref 8.9–10.3)
Chloride: 107 mmol/L (ref 98–111)
Creatinine, Ser: 0.93 mg/dL (ref 0.44–1.00)
GFR calc Af Amer: >60 mL/min (ref >60)
GFR calc non Af Amer: >60 mL/min (ref >60)
Glucose, Bld: 90 mg/dL (ref 70–99)
Potassium: 3.9 mmol/L (ref 3.5–5.1)
Sodium: 139 mmol/L (ref 135–145)

## 2018-12-04 LAB — HEPATIC FUNCTION PANEL
ALT: 14 U/L (ref 0–44)
AST: 16 U/L (ref 15–41)
Albumin: 3.8 g/dL (ref 3.5–5.0)
Alkaline Phosphatase: 49 U/L (ref 38–126)
Bilirubin, Direct: <0.1 mg/dL (ref 0.0–0.2)
Total Bilirubin: 0.2 mg/dL — ABNORMAL LOW (ref 0.3–1.2)
Total Protein: 6.5 g/dL (ref 6.5–8.1)

## 2018-12-04 LAB — TROPONIN I
Troponin I: <0.03 ng/mL (ref <0.03)
Troponin I: <0.03 ng/mL (ref <0.03)

## 2018-12-04 LAB — I-STAT BETA HCG BLOOD, ED (MC, WL, AP ONLY): I-stat hCG, quantitative: <5 m[IU]/mL (ref <5)

## 2018-12-04 LAB — LIPASE, BLOOD: Lipase: 40 U/L (ref 11–51)

## 2018-12-04 MED ORDER — LIDOCAINE VISCOUS HCL 2 % MT SOLN
15.0000 mL | Freq: Once | OROMUCOSAL | Status: AC
  Administered 2018-12-04: 15 mL via ORAL
  Filled 2018-12-04: qty 15

## 2018-12-04 MED ORDER — SODIUM CHLORIDE 0.9% FLUSH: 3.0000 mL | Freq: Once | INTRAVENOUS | Status: DC

## 2018-12-04 MED ORDER — ALUM & MAG HYDROXIDE-SIMETH 200-200-20 MG/5ML PO SUSP
30.0000 mL | Freq: Once | ORAL | Status: AC
  Administered 2018-12-04: 30 mL via ORAL
  Filled 2018-12-04: qty 30

## 2018-12-04 MED ORDER — ONDANSETRON 4 MG PO TBDP: 4.0000 mg | ORAL_TABLET | Freq: Three times a day (TID) | ORAL | 0 refills | Status: AC | PRN

## 2018-12-04 MED ORDER — ONDANSETRON HCL 4 MG/2ML IJ SOLN: 4.0000 mg | Freq: Once | INTRAMUSCULAR | Status: DC

## 2018-12-04 NOTE — ED Provider Notes (Signed)
[facility] Provider Note   CSN: [id number] Arrival date & time: 12/04/18  1401    History   Chief Complaint Chief Complaint  Patient presents with   Chest Pain    HPI    Samantha Hancock is a 43 y.o. 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  She states that she was seen by her PCP last week and had an abdominal ultrasound which just showed a right-sided kidney stone but otherwise was unremarkable per her report, she was started on Protonix Sunday, she states that her labs done at her PCPs office were also unremarkable.  She states that today around 11:30 AM the pain got worse so she decided to come here for evaluation.  She describes the pain as 8/10 constant crampy epigastric/central chest pain that radiates into the left side of her chest and left side of her back, worse after eating, unchanged with inspiration or exertion, improved with laying down and taking aspirin earlier, and unchanged with Tums and Protonix.  She also reports associated nausea.  Her PCP is Dr. Shelia Media.  She is a non-smoker.  She has a family history of MI in her paternal and maternal grandfathers who died in their 82s of their MIs.  She also has a history of MI in her maternal cousin who had an MI at 45 and she survived it.  She is a family history of factor V [place] but denies any known personal or family history of DVT/PE.  She denies any diaphoresis, lightheadedness, fevers, chills, cough, URI symptoms, shortness of breath, leg swelling, recent travel/surgery/immobilization, estrogen use, abdominal pain, vomiting, diarrhea, constipation, dysuria, hematuria, numbness, tingling, focal  weakness, claudication, orthopnea, or any other complaints at this time.  The history is provided by the patient and medical records. No language interpreter was used.  Chest Pain  Associated symptoms: nausea   Associated symptoms: no abdominal pain, no cough, no diaphoresis, no fever, no numbness, no shortness of breath, no vomiting and no weakness     Past Medical History:  Diagnosis Date   Familial multiple factor deficiency syndrome, type IV (HCC)    Hypothyroid    Palpitations     There are no active problems to display for this patient.   Past Surgical History:  Procedure Laterality Date   DILATION AND CURETTAGE OF UTERUS     LEG SURGERY     left leg     OB History    Gravida  1   Para      Term      Preterm      AB      Living        SAB      TAB      Ectopic      Multiple      Live Births               Home Medications    Prior to Admission medications   Medication Sig Start Date End Date Taking? Authorizing Provider  levothyroxine (SYNTHROID) 100 MCG tablet Take 100 mcg by mouth daily. 09/26/18   [provider]  Multiple Vitamin (MULTIVITAMIN) tablet Take 1 tablet by mouth  daily.    [provider]  OVER THE COUNTER MEDICATION Beta Glucan 1 capsule daily    [provider]  OVER THE COUNTER MEDICATION Vita mineral powder greens. Green super foods    [provider]  OVER THE COUNTER MEDICATION Cologen powder    [provider]  OVER THE COUNTER MEDICATION Vitamin d3 gummies    [provider]    Family History Family History  Problem Relation Age of Onset   Other Mother        factor V Leiden   Pancreatic cancer Mother    Aortic aneurysm Maternal Grandmother    Aortic aneurysm Maternal Aunt     Social History Social History   Tobacco Use   Smoking status: Never Smoker   Smokeless tobacco: Never Used  Substance Use Topics   Alcohol use: Yes   Drug use: Never       Allergies   Amoxil [amoxicillin] and Penicillins   Review of Systems Review of Systems  Constitutional: Negative for chills, diaphoresis and fever.  HENT: Negative for rhinorrhea and sore throat.   Respiratory: Negative for cough and shortness of breath.   Cardiovascular: Positive for chest pain. Negative for leg swelling.  Gastrointestinal: Positive for nausea. Negative for abdominal pain, constipation, diarrhea and vomiting.  Genitourinary: Negative for dysuria and hematuria.  Musculoskeletal: Negative for arthralgias and myalgias.  Skin: Negative for color change.  Allergic/Immunologic: Negative for immunocompromised state.  Neurological: Negative for weakness, light-headedness and numbness.  Psychiatric/Behavioral: Negative for confusion.   All other systems reviewed and are negative for acute change except as noted in the HPI.    Physical Exam Updated Vital Signs BP 122/82 (BP Location: Right Arm)    Pulse 80    Temp 98.3 F (36.8 C) (Oral)    Resp 18    Ht 5' 7.5" (1.715 m)    Wt 64.9 kg    LMP 11/30/2018    SpO2 100%    Breastfeeding Unknown    BMI 22.07 kg/m   Physical Exam Vitals signs and nursing note reviewed.  Constitutional:      General: She is not in acute distress.    Appearance: Normal appearance. She is well-developed. She is not toxic-appearing.     Comments: Afebrile, nontoxic, NAD  HENT:     Head: Normocephalic and atraumatic.  Eyes:     General:        Right eye: No discharge.        Left eye: No discharge.     Conjunctiva/sclera: Conjunctivae normal.  Neck:     Musculoskeletal: Normal range of motion and neck supple.  Cardiovascular:     Rate and Rhythm: Normal rate and regular rhythm.     Pulses: Normal pulses.     Heart sounds: Normal heart sounds, S1 normal and S2 normal. No murmur. No friction rub. No gallop.      Comments: RRR, nl s1/s2, no m/r/g, distal pulses intact, no pedal edema  Pulmonary:     Effort: Pulmonary effort is normal.  No respiratory distress.     Breath sounds: Normal breath sounds. No decreased breath sounds, wheezing, rhonchi or rales.     Comments: CTAB in all lung fields, no w/r/r, no hypoxia or increased WOB, speaking in full sentences, SpO2 100% on RA Chest:     Chest wall: No deformity, tenderness or crepitus.     Comments: Chest wall nonTTP without crepitus, deformities, or retractions  Abdominal:  General: Bowel sounds are normal. There is no distension.     Palpations: Abdomen is soft. Abdomen is not rigid.     Tenderness: There is no abdominal tenderness. There is no right CVA tenderness, left CVA tenderness, guarding or rebound. Negative signs include Murphy's sign and McBurney's sign.     Comments: Soft, NTND, +BS throughout, no r/g/r, neg murphy's, neg mcburney's, no CVA TTP   Musculoskeletal: Normal range of motion.  Skin:    General: Skin is warm and dry.     Findings: No rash.  Neurological:     Mental Status: She is alert and oriented to person, place, and time.     Sensory: Sensation is intact. No sensory deficit.     Motor: Motor function is intact.  Psychiatric:        Mood and Affect: Mood and affect normal.        Behavior: Behavior normal.      ED Treatments / Results  Labs (all labs ordered are listed, but only abnormal results are displayed) Labs Reviewed  HEPATIC FUNCTION PANEL - Abnormal; Notable for the following components:      Result Value   Total Bilirubin 0.2 (*)    All other components within normal limits  BASIC METABOLIC PANEL  CBC  TROPONIN I  TROPONIN I  LIPASE, BLOOD  I-STAT BETA HCG BLOOD, ED ([facility], [facility], [facility] ONLY)    EKG EKG Interpretation  Date/Time:  Friday Dec 04 2018 14:23:35 EDT Ventricular Rate:  83 PR Interval:  110 QRS Duration: 82 QT Interval:  370 QTC Calculation: 434 R Axis:   77 Text Interpretation:  Sinus rhythm with sinus arrhythmia with short PR T wave abnormality, consider inferior ischemia Abnormal ECG Since last tracing  inferior T wave abnormality is more prominant Confirmed by Daleen Bo [phone]) on 12/04/2018 4:57:53 PM   Radiology Dg Chest 2 View  Result Date: 12/04/2018 CLINICAL DATA:  Chest pain EXAM: CHEST - 2 VIEW COMPARISON:  None. FINDINGS: Lungs are hyperinflated. There is no focal airspace consolidation or pulmonary edema. No pleural effusion or pneumothorax. Cardiomediastinal contours are normal. IMPRESSION: No active cardiopulmonary disease. Electronically Signed   By: Ulyses Jarred M.D.   On: 12/04/2018 15:25    Procedures Procedures (including critical care time)  Medications Ordered in ED Medications  sodium chloride flush (NS) 0.9 % injection 3 mL (has no administration in time range)  ondansetron (ZOFRAN) injection 4 mg (4 mg Intravenous Refused 12/04/18 1716)  alum & mag hydroxide-simeth (MAALOX/MYLANTA) 200-200-20 MG/5ML suspension 30 mL (30 mLs Oral Given 12/04/18 1715)    And  lidocaine (XYLOCAINE) 2 % viscous mouth solution 15 mL (15 mLs Oral Given 12/04/18 1715)     Initial Impression / Assessment and Plan / ED Course  I have reviewed the triage vital signs and the nursing notes.  Pertinent labs & imaging results that were available during my care of the patient were reviewed by me and considered in my medical decision making (see chart for details).        43 y.o. female here with CP x1 month intermittently but more daily/persistent since 6 days ago, and worse this morning at 11:30am. Reports being seen by her PCP last week and had abd U/S done which showed R kidney stone but otherwise negative per her report, had labs done which were unremarkable, was started on protonix which she doesn't think is helping. On exam today, no abdominal or chest tenderness, PERC neg, no tachycardia or hypoxia,  clear lung exam, no reproducibility of her discomfort on exam, no pedal edema. Work up thus far reveals: EKG with some TWI which is slightly similar to prior, no other acute findings; CXR  neg; trop neg; CBC WNL; BMP WNL; betaHCG neg. Sounds mostly like GI source, but will get repeat trop at 3hr mark from first, will also get LFTs and lipase to ensure these are not abnormal. Will give zofran and GI cocktail and reassess shortly. Doubt need for repeat abd U/S imaging currently since she reports it was negative last week, and no abdominal tenderness on exam. Will reassess shortly.   7:47 PM LFTs WNL. Lipase WNL. Repeat trop neg. Pt feeling better and tolerating PO well. Suspect GI etiology, doubt ACS, dissection, PE, etc. Doubt need for further emergent work up at this time. Advised continuation of protonix, will rx zofran, advised OTC remedies for symptomatic relief, and f/up with PCP in 5-7 days for recheck, also f/up with her cardiologist regarding slightly abnormal EKG. I explained the diagnosis and have given explicit precautions to return to the ER including for any other new or worsening symptoms. The patient understands and accepts the medical plan as it's been dictated and I have answered their questions. Discharge instructions concerning home care and prescriptions have been given. The patient is STABLE and is discharged to home in good condition.    Final Clinical Impressions(s) / ED Diagnoses   Final diagnoses:  Atypical chest pain  Nausea    ED Discharge Orders         Ordered    ondansetron (ZOFRAN ODT) 4 MG disintegrating tablet  Every 8 hours PRN     12/04/18 [address] [place], [place] 12/04/18 1947    Daleen Bo, MD 12/05/18 [phone]

## 2018-12-04 NOTE — Telephone Encounter (Signed)
Getting ready to call pt re: echo appointment and see that she called back today and triaged and adv to go to ER for evaluation.  Will wait to call pt re: scheduling echo at this time.

## 2018-12-04 NOTE — ED Notes (Signed)
Pt updating family herself.

## 2018-12-04 NOTE — ED Triage Notes (Signed)
Pt sent her for chest pain. Pt states the doctor told her she needed blood work. Pt reports this has been going on for a month intermittently but not the pain is constant since Saturday.

## 2018-12-04 NOTE — Telephone Encounter (Signed)
New Message    Pt is outside in parking deck She said she was scheduled for an Echo today at 8:40    Please call

## 2018-12-04 NOTE — Discharge Instructions (Addendum)
Your labs and xray are all reassuring, your chest pain could be a variety of things including gas pain, muscle pain, indigestion, and other nonemergent issues. Use tylenol as needed for pain, if you must take something else you can take ibuprofen but always taking these on a full stomach and NEVER on an empty stomach. Stay well hydrated. Use zofran as directed as needed for nausea. You may consider using heat to the areas of pain, no more than 20 minutes every hour. If you think indigestion could be contributing to your symptoms, you may use over the counter tums, maalox, pepto bismol, zantac, etc to help with symptoms; avoid spicy/fatty/fried/acidic foods. Continue taking your protonix as directed. Follow up with your regular doctor in 5-7 days for recheck of symptoms. Also follow up with your cardiologist in 1-2 weeks for recheck and repeat EKG. Return to the ER for changes or worsening symptoms.  SEEK IMMEDIATE MEDICAL ATTENTION IF: You develop a fever.  Your chest pains become severe or intolerable.  You develop new, unexplained symptoms (problems).  You develop shortness of breath, nausea, vomiting, sweating or feel light headed.  You develop a new cough or you cough up blood. You develop new leg swelling

## 2018-12-04 NOTE — Telephone Encounter (Signed)
Late entry for 9:20 am The pt had a previously scheduled appt today at 8:40 am with Dr. Harrington Challenger.  It was changed to virtual and completed on 11/20/18.  At that time an echo was ordered but has not yet been scheduled.   She was called by echo scheduler this morning to follow up.  Pt had stayed in her car for several minutes after making phone call to [place], and then realized she had gotten confused about appointments. Received message that pt did not want to schedule the echo with the echo scheduler.  She wanted to hear from Dr. Harrington Challenger first.    MWilson, RN 12/04/18 2:05 pm.

## 2018-12-04 NOTE — Telephone Encounter (Signed)
Pt called to report that she has been having chest discomfort every week for the past month. Since yesterday she has had a cramping type pain in the center of her chest radiating to her left side. She says it is hard to breathe and she just does not feel well. She has a family history with both her grandfathers having early MI's in their 95s. Her PCP put her on Protonix and she had improvement initially but it since has returned and worsened. She is having it now and very anxious. I have recommended that she consider going over to the ER for a more immediate assessment. She denies cough, fever, palpitations. She agreed to go and also feels that is her best option.  Will forward to Dr. Harrington Challenger for her review.

## 2018-12-04 NOTE — ED Notes (Signed)
Discharge instructions and prescriptions discussed with Pt. Pt verbalized understanding. Pt stable and ambulatory.   

## 2019-07-14 ENCOUNTER — Ambulatory Visit: Payer: Commercial Managed Care - PPO | Attending: Internal Medicine

## 2019-07-14 DIAGNOSIS — Z20822 Contact with and (suspected) exposure to covid-19: Secondary | ICD-10-CM

## 2019-07-15 LAB — NOVEL CORONAVIRUS, NAA: SARS-CoV-2, NAA: NOT DETECTED

## 2020-08-21 IMAGING — CR CHEST - 2 VIEW
2 series · 2 of 2 positions shown · non-contrast
Comparison: None.

CLINICAL DATA: Chest pain

EXAM:
CHEST - 2 VIEW

[chest pa]
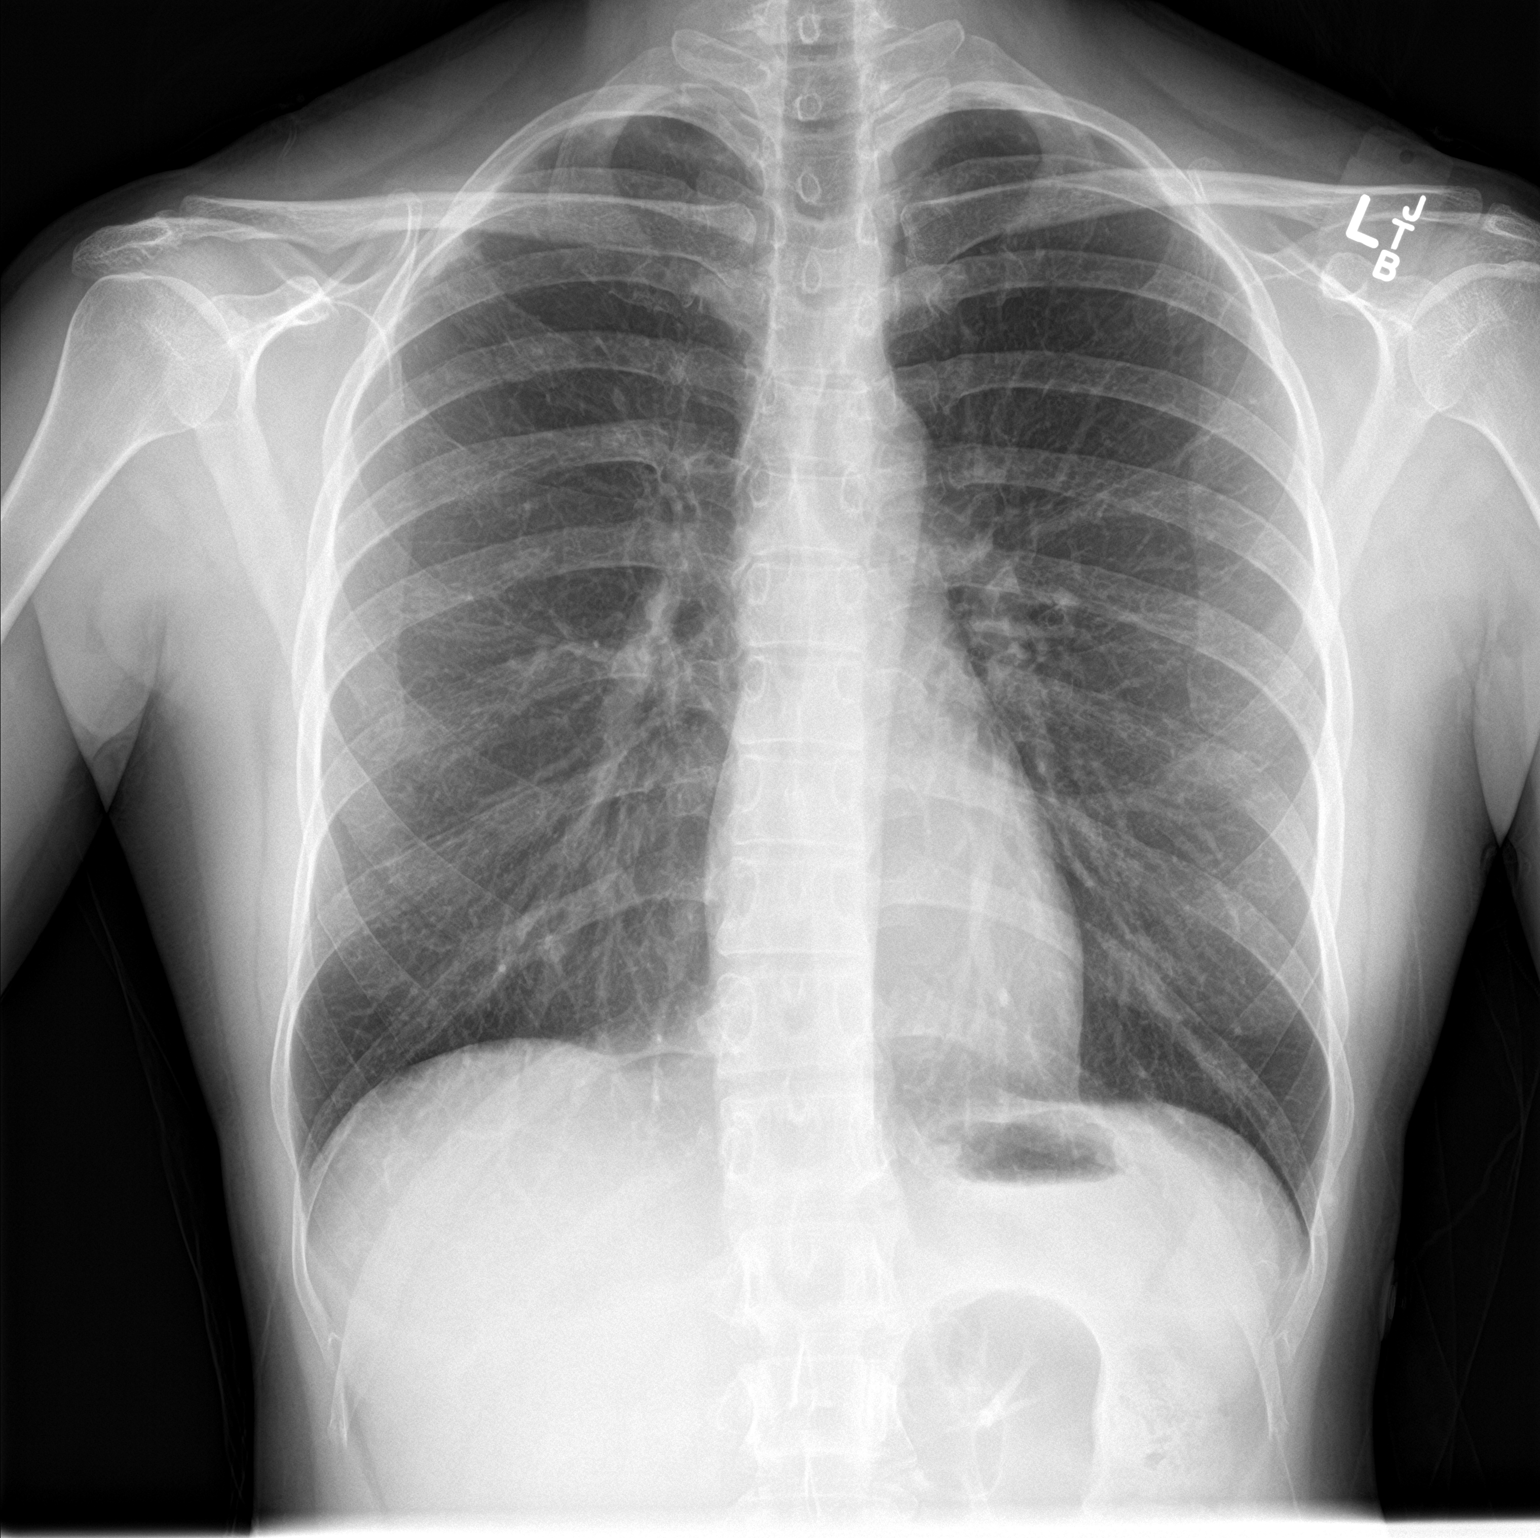

[chest lat]
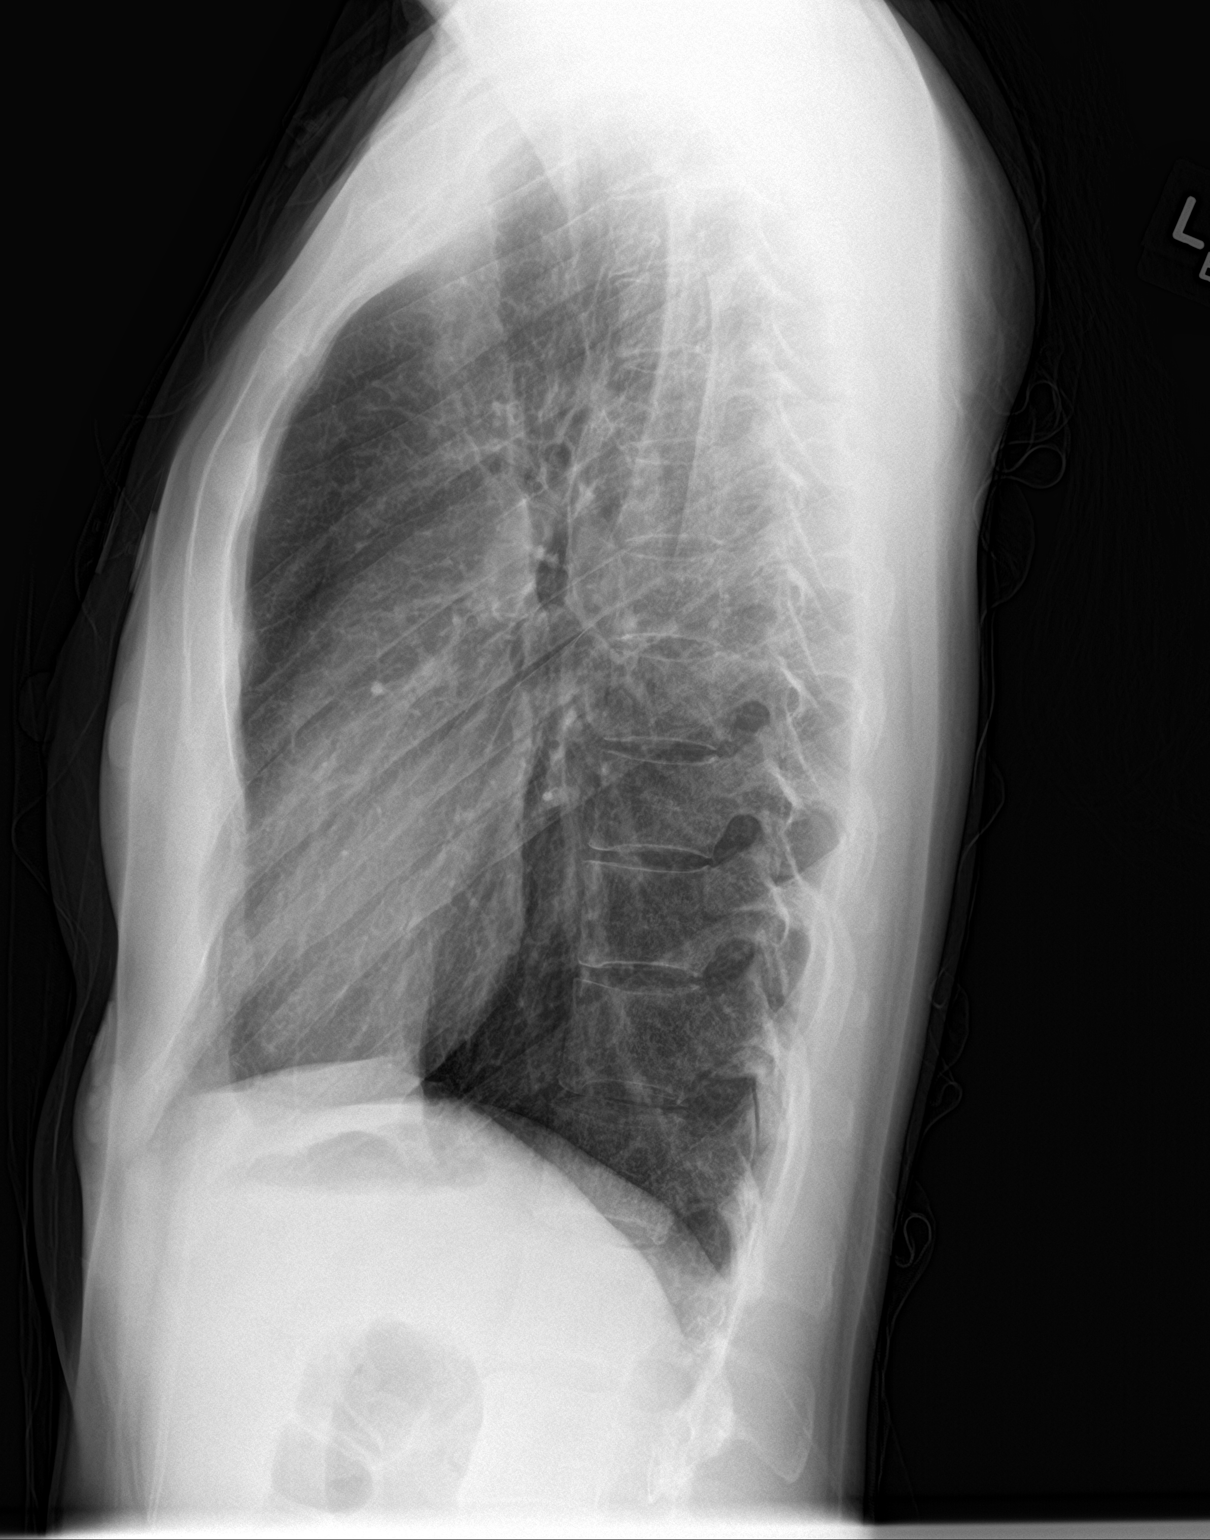

[2 of 2 positions shown; findings below may reference images not displayed]

FINDINGS: Lungs are hyperinflated. There is no focal airspace consolidation or
pulmonary edema. No pleural effusion or pneumothorax.
Cardiomediastinal contours are normal.
IMPRESSION: No active cardiopulmonary disease.

## 2020-10-18 ENCOUNTER — Telehealth: Payer: Self-pay | Admitting: Internal Medicine

## 2020-10-18 NOTE — Telephone Encounter (Signed)
Hey Dr Carlean Purl, this pt is being referred from [facility] for a colonoscopy but it looks like the pt saw [facility] on 01/2020, notes are in epic for review, please advise on scheduling

## 2020-10-20 NOTE — Telephone Encounter (Signed)
Please schedule direct colonoscopy re: colon cancer screening

## 2020-11-07 NOTE — Telephone Encounter (Signed)
Left msg on pt's vm to call back 

## 2021-08-16 DIAGNOSIS — Z808 Family history of malignant neoplasm of other organs or systems: Secondary | ICD-10-CM | POA: Diagnosis not present

## 2021-08-16 DIAGNOSIS — Z01419 Encounter for gynecological examination (general) (routine) without abnormal findings: Secondary | ICD-10-CM | POA: Diagnosis not present

## 2021-08-16 DIAGNOSIS — E039 Hypothyroidism, unspecified: Secondary | ICD-10-CM | POA: Diagnosis not present

## 2021-08-22 DIAGNOSIS — Z1231 Encounter for screening mammogram for malignant neoplasm of breast: Secondary | ICD-10-CM | POA: Diagnosis not present

## 2021-12-13 DIAGNOSIS — E039 Hypothyroidism, unspecified: Secondary | ICD-10-CM | POA: Diagnosis not present

## 2021-12-27 DIAGNOSIS — Z1339 Encounter for screening examination for other mental health and behavioral disorders: Secondary | ICD-10-CM | POA: Diagnosis not present

## 2021-12-27 DIAGNOSIS — Z1331 Encounter for screening for depression: Secondary | ICD-10-CM | POA: Diagnosis not present

## 2021-12-27 DIAGNOSIS — Z0000 Encounter for general adult medical examination without abnormal findings: Secondary | ICD-10-CM | POA: Diagnosis not present

## 2021-12-27 DIAGNOSIS — K219 Gastro-esophageal reflux disease without esophagitis: Secondary | ICD-10-CM | POA: Diagnosis not present

## 2022-01-07 ENCOUNTER — Encounter: Payer: Self-pay | Admitting: Nurse Practitioner

## 2022-02-01 ENCOUNTER — Ambulatory Visit: Payer: BC Managed Care – PPO | Admitting: Nurse Practitioner

## 2022-02-01 ENCOUNTER — Encounter: Payer: Self-pay | Admitting: Nurse Practitioner

## 2022-02-01 VITALS — BP 90/70 | HR 72 | Ht 67.0 in | Wt 154.2 lb

## 2022-02-01 DIAGNOSIS — R1013 Epigastric pain: Secondary | ICD-10-CM

## 2022-02-01 DIAGNOSIS — Z1211 Encounter for screening for malignant neoplasm of colon: Secondary | ICD-10-CM

## 2022-02-01 DIAGNOSIS — R110 Nausea: Secondary | ICD-10-CM | POA: Diagnosis not present

## 2022-02-01 MED ORDER — NA SULFATE-K SULFATE-MG SULF 17.5-3.13-1.6 GM/177ML PO SOLN: 1.0000 | Freq: Once | ORAL | 0 refills | Status: AC

## 2022-02-01 NOTE — Progress Notes (Signed)
Chief Complaint:  epigastric burning   Assessment & Plan   # 46 yo female, new to the practice, here with a several month history of epigastric burning /cramping paritally relieved by PPI over the last month. Also with occasional nausea over last couple of weeks. Unclear etiology. Doesn't appear to be related to eating. Doesn't seem musculoskeletal. Gastritis ? PUD?  Will schedule for EGD to be done at time of screening colonoscopy. The risks and benefits of EGD and colonoscopy with possible biopsies and possible polypectomy were discussed with the patient who agrees to proceed.  Continue daily Omeprazole. She inquires about taking it twice a daily for now. This is reasonable to try if she decides to.   # Colon cancer screening. No alarm symptoms. No [place] of colon cancer.  Proceed with screening colonoscopy  # Family history of pancreatic cancer in mother at age 13.  Evaluated at [facility] in July 2021 for possible high risk pancreas cancer screening. Per their note ( Dr. Francella Solian)  patient has no known personal genetic mutation, she does not currently meet the [facility] accepted criteria for pancreatic cancer screening based on the International Cancer of the [place] (CAPS).  Her mother did have genetic mutations that are known to increase the risk of cancer development and we strongly recommend Samantha Hancock complete genetic testing. If she is found to have a genetic mutation OR if she confirms additional family members with pancreas adenocarcinoma, she would qualify for screening in that setting. She opted to hold off on further testing.    # Factor V Leiden   HPI:    46 yo pleasant female with a PMH significant for hypothyroidism, Factor V Leiden,  family history of pancreatic cancer in mother at age 14,   Samantha Hancock is new to the practice, referred by Dr. Jacalyn Lefevre for colon cancer screening and evaluation of upper abdominal discomfort. She has had intermittent crampy / burning  epigastric pain since March. The discomfort is occurring with increased frequency as of late.  She cannot correlate the pain to eating.The pain sometimes radiates into her chest and also LUQ towards her back.  Over the last couple of weeks she has had a couple of episodes of nausea without vomiting.   Samantha Hancock had similar symptoms in 2020 at which time PCP treated her with what sounds like pantoprazole. She had complete resolution of pain but cannot recall how long she took the medication.  About a month ago she was started on Omeprazole. Though the discomfort has improved it has not resolved and she is concerned.  Her weight is stable. Bowel movements are "regular". No blood in stools. She only occasionally takes NSAIDS.   LFTs normal - PCP's office (she pulled up results on phone).    Previous GI Evaluation   none  Past Medical History:  Diagnosis Date   Familial multiple factor deficiency syndrome, type IV ([place])    Hypothyroid    Palpitations    Past Surgical History:  Procedure Laterality Date   DILATION AND CURETTAGE OF UTERUS     LEG SURGERY     left leg   Family History  Problem Relation Age of Onset   Other Mother        factor V Leiden   Pancreatic cancer Mother    Aortic aneurysm Maternal Grandmother    Aortic aneurysm Maternal Aunt    Social History   Tobacco Use   Smoking status: Never   Smokeless tobacco: Never  Vaping Use   Vaping Use: Never used  Substance Use Topics   Alcohol use: Yes   Drug use: Never   Current Outpatient Medications  Medication Sig Dispense Refill   Cholecalciferol (VITAMIN D-1000 MAX ST) 25 MCG (1000 UT) tablet Take 1 tablet by mouth daily.     levothyroxine (SYNTHROID) 100 MCG tablet Take 100 mcg by mouth daily.     Multiple Vitamin (MULTIVITAMIN) tablet Take 1 tablet by mouth daily.     ondansetron (ZOFRAN ODT) 4 MG disintegrating tablet Take 1 tablet (4 mg total) by mouth every 8 (eight) hours as needed for nausea or vomiting.  (Patient not taking: Reported on 02/01/2022) 15 tablet 0   OVER THE COUNTER MEDICATION Beta Glucan 1 capsule daily     OVER THE COUNTER MEDICATION Vita mineral powder greens. Green super foods     OVER THE COUNTER MEDICATION Cologen powder     OVER THE COUNTER MEDICATION Vitamin d3 gummies     pantoprazole (PROTONIX) 40 MG tablet Take 40 mg by mouth daily.     No current facility-administered medications for this visit.   Allergies  Allergen Reactions   Amoxil [Amoxicillin] Rash   Penicillins Other (See Comments) and Rash    Other Reaction: Other reaction      Review of Systems: Positive for allergy, sinus trouble , headaches, anxiety, menstrual pain.  All other systems reviewed and negative except where noted in HPI.   Wt Readings from Last 3 Encounters:  02/01/22 154 lb 4 oz (70 kg)  12/04/18 143 lb (64.9 kg)  11/20/18 143 lb 6.4 oz (65 kg)    Physical Exam   Ht '5\' 7"'$  (1.702 m) Comment: height measured without shoes  Wt 154 lb 4 oz (70 kg)   BMI 24.16 kg/m  Constitutional:  Generally well appearing female in no acute distress. Psychiatric: Pleasant. Normal mood and affect. Behavior is normal. EENT: Pupils normal.  Conjunctivae are normal. No scleral icterus. Neck supple.  Cardiovascular: Normal rate, regular rhythm. No edema Pulmonary/chest: Effort normal and breath sounds normal. No wheezing, rales or rhonchi. Abdominal: Soft, nondistended, nontender. Bowel sounds active throughout. There are no masses palpable. No hepatomegaly. Neurological: Alert and oriented to person place and time. Skin: Skin is warm and dry. No rashes noted.  Tye Savoy, NP  02/01/2022, 10:32 AM  Cc:  Referring Provider Cristie Hem, MD

## 2022-02-01 NOTE — Patient Instructions (Signed)
You have been scheduled for an endoscopy and colonoscopy. Please follow the written instructions given to you at your visit today. Please pick up your prep supplies at the pharmacy within the next 1-3 days. If you use inhalers (even only as needed), please bring them with you on the day of your procedure.  If you are age 48 or older, your body mass index should be between 23-30. Your Body mass index is 24.16 kg/m. If this is out of the aforementioned range listed, please consider follow up with your Primary Care Provider.  If you are age 68 or younger, your body mass index should be between 19-25. Your Body mass index is 24.16 kg/m. If this is out of the aformentioned range listed, please consider follow up with your Primary Care Provider.   ________________________________________________________  The [facility] providers would like to encourage you to use [facility] to communicate with providers for non-urgent requests or questions.  Due to long hold times on the telephone, sending your provider a message by [facility] may be a faster and more efficient way to get a response.  Please allow 48 business hours for a response.  Please remember that this is for non-urgent requests.  _______________________________________________________

## 2022-02-04 NOTE — Progress Notes (Signed)
Addendum: Reviewed and agree with assessment and management plan. Riot Barrick M, MD  

## 2022-02-11 ENCOUNTER — Encounter: Payer: Self-pay | Admitting: Internal Medicine

## 2022-02-18 ENCOUNTER — Encounter: Payer: BC Managed Care – PPO | Admitting: Internal Medicine

## 2022-02-18 ENCOUNTER — Encounter: Payer: Self-pay | Admitting: Internal Medicine

## 2022-02-20 ENCOUNTER — Telehealth: Payer: Self-pay | Admitting: Nurse Practitioner

## 2022-02-20 NOTE — Telephone Encounter (Signed)
This is currently coded as a screening/preventative colonoscopy.

## 2022-02-20 NOTE — Telephone Encounter (Signed)
This pt is wanting to know if the colonoscopy she is having done will be coded as preventative since she is only experiencing upper abdominal pain,

## 2022-03-07 NOTE — Telephone Encounter (Signed)
Patient called to follow up on procedure coding question requested a call back. Sent Joey a staff message as well to call her back.

## 2022-03-11 ENCOUNTER — Other Ambulatory Visit: Payer: Self-pay | Admitting: Internal Medicine

## 2022-03-11 ENCOUNTER — Encounter: Payer: Self-pay | Admitting: Internal Medicine

## 2022-03-11 ENCOUNTER — Ambulatory Visit (AMBULATORY_SURGERY_CENTER): Payer: BC Managed Care – PPO | Admitting: Internal Medicine

## 2022-03-11 VITALS — BP 114/78 | HR 76 | Temp 98.2°F | Resp 13 | Ht 67.0 in | Wt 154.0 lb

## 2022-03-11 DIAGNOSIS — D122 Benign neoplasm of ascending colon: Secondary | ICD-10-CM

## 2022-03-11 DIAGNOSIS — D123 Benign neoplasm of transverse colon: Secondary | ICD-10-CM

## 2022-03-11 DIAGNOSIS — K635 Polyp of colon: Secondary | ICD-10-CM

## 2022-03-11 DIAGNOSIS — K5140 Inflammatory polyps of colon without complications: Secondary | ICD-10-CM | POA: Diagnosis not present

## 2022-03-11 DIAGNOSIS — K2100 Gastro-esophageal reflux disease with esophagitis, without bleeding: Secondary | ICD-10-CM | POA: Diagnosis not present

## 2022-03-11 DIAGNOSIS — R1013 Epigastric pain: Secondary | ICD-10-CM

## 2022-03-11 DIAGNOSIS — R110 Nausea: Secondary | ICD-10-CM

## 2022-03-11 DIAGNOSIS — D120 Benign neoplasm of cecum: Secondary | ICD-10-CM

## 2022-03-11 DIAGNOSIS — Z1211 Encounter for screening for malignant neoplasm of colon: Secondary | ICD-10-CM

## 2022-03-11 DIAGNOSIS — K219 Gastro-esophageal reflux disease without esophagitis: Secondary | ICD-10-CM | POA: Diagnosis not present

## 2022-03-11 DIAGNOSIS — R6884 Jaw pain: Secondary | ICD-10-CM

## 2022-03-11 HISTORY — PX: COLONOSCOPY WITH ESOPHAGOGASTRODUODENOSCOPY (EGD): SHX5779

## 2022-03-11 MED ORDER — SODIUM CHLORIDE 0.9 % IV SOLN: 500.0000 mL | Freq: Once | INTRAVENOUS | Status: DC

## 2022-03-11 MED ORDER — HYDROCODONE-ACETAMINOPHEN 5-325 MG PO TABS: 1.0000 | ORAL_TABLET | Freq: Four times a day (QID) | ORAL | 0 refills | Status: AC | PRN

## 2022-03-11 NOTE — Op Note (Signed)
[facility] Patient Name: Samantha Hancock Procedure Date: 03/11/2022 2:11 PM MRN: [id number] Endoscopist: Jerene Bears , MD Age: 46 Referring MD:  Date of Birth: April 18, 1976 Gender: Female Account #: [id number] Procedure:                Colonoscopy Indications:              Screening for colorectal malignant neoplasm, This                            is the patient's first colonoscopy Medicines:                Monitored Anesthesia Care Procedure:                Pre-Anesthesia Assessment:                           - Prior to the procedure, a History and Physical                            was performed, and patient medications and                            allergies were reviewed. The patient's tolerance of                            previous anesthesia was also reviewed. The risks                            and benefits of the procedure and the sedation                            options and risks were discussed with the patient.                            All questions were answered, and informed consent                            was obtained. Prior Anticoagulants: The patient has                            taken no previous anticoagulant or antiplatelet                            agents. ASA Grade Assessment: II - A patient with                            mild systemic disease. After reviewing the risks                            and benefits, the patient was deemed in                            satisfactory condition to undergo the procedure.  After obtaining informed consent, the colonoscope                            was passed under direct vision. Throughout the                            procedure, the patient's blood pressure, pulse, and                            oxygen saturations were monitored continuously. The                            PCF-HQ190L Colonoscope was introduced through the                            anus and advanced to the  cecum, identified by                            appendiceal orifice and ileocecal valve. The                            colonoscopy was performed without difficulty. The                            patient tolerated the procedure well. The quality                            of the bowel preparation was excellent. The                            ileocecal valve, appendiceal orifice, and rectum                            were photographed. Scope In: 2:30:58 PM Scope Out: 2:49:14 PM Scope Withdrawal Time: 0 hours 14 minutes 20 seconds  Total Procedure Duration: 0 hours 18 minutes 16 seconds  Findings:                 The digital rectal exam was normal.                           A 7 mm polyp was found in the cecum. The polyp was                            sessile. The polyp was removed with a cold snare.                            Resection and retrieval were complete.                           A 3 mm polyp was found in the proximal ascending                            colon. The polyp was sessile. The polyp was  removed                            with a cold snare. Resection and retrieval were                            complete.                           A 10 mm polyp was found in the hepatic flexure. The                            polyp was pedunculated. The polyp was removed with                            a hot snare. Resection and retrieval were complete.                           Internal hemorrhoids were found during                            retroflexion. The hemorrhoids were small. Complications:            No immediate complications. Estimated Blood Loss:     Estimated blood loss: none. Impression:               - One 7 mm polyp in the cecum, removed with a cold                            snare. Resected and retrieved.                           - One 3 mm polyp in the proximal ascending colon,                            removed with a cold snare. Resected and retrieved.                            - One 10 mm polyp at the hepatic flexure, removed                            with a hot snare. Resected and retrieved.                           - Small internal hemorrhoids. Recommendation:           - Patient has a contact number available for                            emergencies. The signs and symptoms of potential                            delayed complications were discussed with the  patient. Return to normal activities tomorrow.                            Written discharge instructions were provided to the                            patient.                           - Resume previous diet.                           - Continue present medications.                           - No aspirin, ibuprofen, naproxen, or other                            non-steroidal anti-inflammatory drugs for 2 weeks                            after polyp removal.                           - Await pathology results.                           - Repeat colonoscopy is recommended. The                            colonoscopy date will be determined after pathology                            results from today's exam become available for                            review. Jerene Bears, MD 03/11/2022 3:00:42 PM This report has been signed electronically.

## 2022-03-11 NOTE — Patient Instructions (Signed)
Resume previous medications.  3 polyps removed and sent to pathology.  Biopsies taken. Await results for final recommendations.    Handouts on findings given to patient.   ( Polyps, hemorrhoids)  NO ASPIRIN, ASPIRIN CONTAINING PRODUCTS (BC OR GOODY POWDERS) OR NSAIDS (IBUPROFEN, ADVIL, ALEVE, AND MOTRIN) FOR 2 weeks; TYLENOL IS OK TO TAKE   YOU HAD AN ENDOSCOPIC PROCEDURE TODAY AT THE [facility]:   Refer to the procedure report that was given to you for any specific questions about what was found during the examination.  If the procedure report does not answer your questions, please call your gastroenterologist to clarify.  If you requested that your care partner not be given the details of your procedure findings, then the procedure report has been included in a sealed envelope for you to review at your convenience later.  YOU SHOULD EXPECT: Some feelings of bloating in the abdomen. Passage of more gas than usual.  Walking can help get rid of the air that was put into your GI tract during the procedure and reduce the bloating. If you had a lower endoscopy (such as a colonoscopy or flexible sigmoidoscopy) you may notice spotting of blood in your stool or on the toilet paper. If you underwent a bowel prep for your procedure, you may not have a normal bowel movement for a few days.  Please Note:  You might notice some irritation and congestion in your nose or some drainage.  This is from the oxygen used during your procedure.  There is no need for concern and it should clear up in a day or so.  SYMPTOMS TO REPORT IMMEDIATELY:  Following lower endoscopy (colonoscopy or flexible sigmoidoscopy):  Excessive amounts of blood in the stool  Significant tenderness or worsening of abdominal pains  Swelling of the abdomen that is new, acute  Fever of 100F or higher  Following upper endoscopy (EGD)  Vomiting of blood or coffee ground material  New chest pain or pain under the shoulder  blades  Painful or persistently difficult swallowing  New shortness of breath  Fever of 100F or higher  Black, tarry-looking stools  For urgent or emergent issues, a gastroenterologist can be reached at any hour by calling [phone]. Do not use MyChart messaging for urgent concerns.    DIET:  We do recommend a small meal at first, but then you may proceed to your regular diet.  Drink plenty of fluids but you should avoid alcoholic beverages for 24 hours.  ACTIVITY:  You should plan to take it easy for the rest of today and you should NOT DRIVE or use heavy machinery until tomorrow (because of the sedation medicines used during the test).    FOLLOW UP: Our staff will call the number listed on your records the next business day following your procedure.  We will call around 7:15- 8:00 am to check on you and address any questions or concerns that you may have regarding the information given to you following your procedure. If we do not reach you, we will leave a message.  If you develop any symptoms (ie: fever, flu-like symptoms, shortness of breath, cough etc.) before then, please call [phone].  If you test positive for Covid 19 in the 2 weeks post procedure, please call and report this information to [place].    If any biopsies were taken you will be contacted by phone or by letter within the next 1-3 weeks.  Please call us at [phone] if  you have not heard about the biopsies in 3 weeks.    SIGNATURES/CONFIDENTIALITY: You and/or your care partner have signed paperwork which will be entered into your electronic medical record.  These signatures attest to the fact that that the information above on your After Visit Summary has been reviewed and is understood.  Full responsibility of the confidentiality of this discharge information lies with you and/or your care-partner.

## 2022-03-11 NOTE — Progress Notes (Signed)
Pt allowed to wake up some in procedure room to judge pain level with jaw.  Pt states her left jaw hurts and she has undiagnosed TMJ.  Pt taken to recovery and awake more she self "pops" jaw back into place

## 2022-03-11 NOTE — Progress Notes (Signed)
Report to PACU, RN, vss, BBS= Clear.  

## 2022-03-11 NOTE — Progress Notes (Signed)
Pt to recovery.  Report given that her jaw "locked up" during the procedure and she is not able to close it.  Upon arrival to recovery, she is able to close it.  She states that this has happened previously.  Left side painful.  Patient rates pain 7/10. Ice applied to jaw.  Dr. Hilarie Fredrickson in to talk to pt and care partner.    3:32 Per patient, pain 5/10.  Rx sent for hydrocodone to pts pharmacy.    3:38 Pt rates pain 3-4/10.  Will plan to d/c home. Pt will f/u with ENT.

## 2022-03-11 NOTE — Op Note (Signed)
[facility] Patient Name: Samantha Hancock Procedure Date: 03/11/2022 2:12 PM MRN: [id number] Endoscopist: Jerene Bears , MD Age: 46 Referring MD:  Date of Birth: May 06, 1976 Gender: Female Account #: [id number] Procedure:                Upper GI endoscopy Indications:              Epigastric abdominal pain partially responsive to                            PPI; pt has been off PPI for several weeks (now                            using PRN roughly 1/week) Medicines:                Monitored Anesthesia Care Procedure:                Pre-Anesthesia Assessment:                           - Prior to the procedure, a History and Physical                            was performed, and patient medications and                            allergies were reviewed. The patient's tolerance of                            previous anesthesia was also reviewed. The risks                            and benefits of the procedure and the sedation                            options and risks were discussed with the patient.                            All questions were answered, and informed consent                            was obtained. Prior Anticoagulants: The patient has                            taken no previous anticoagulant or antiplatelet                            agents. ASA Grade Assessment: II - A patient with                            mild systemic disease. After reviewing the risks                            and benefits, the patient was deemed in  satisfactory condition to undergo the procedure.                           After obtaining informed consent, the endoscope was                            passed under direct vision. Throughout the                            procedure, the patient's blood pressure, pulse, and                            oxygen saturations were monitored continuously. The                            Endoscope was introduced  through the mouth, and                            advanced to the second part of duodenum. The upper                            GI endoscopy was accomplished without difficulty.                            The patient tolerated the procedure well. Scope In: Scope Out: Findings:                 The examined esophagus was normal. Biopsies were                            taken with a cold forceps for histology from the                            distal esophagus.                           The cardia and gastric fundus were normal on                            retroflexion.                           The entire examined stomach was normal. Biopsies                            were taken with a cold forceps for histology and                            Helicobacter pylori testing.                           The examined duodenum was normal. Biopsies for                            histology were taken with a cold forceps for  evaluation of celiac disease. Complications:            No immediate complications. Estimated Blood Loss:     Estimated blood loss was minimal. Impression:               - Normal esophagus. Biopsied.                           - Normal stomach. Biopsied.                           - Normal examined duodenum. Biopsied. Recommendation:           - Patient has a contact number available for                            emergencies. The signs and symptoms of potential                            delayed complications were discussed with the                            patient. Return to normal activities tomorrow.                            Written discharge instructions were provided to the                            patient.                           - Resume previous diet.                           - Continue present medications.                           - Await pathology results.                           - See the other procedure note for documentation  of                            additional recommendations. Jerene Bears, MD 03/11/2022 2:57:56 PM This report has been signed electronically.

## 2022-03-11 NOTE — Progress Notes (Signed)
Called to room to assist during endoscopic procedure.  Patient ID and intended procedure confirmed with present staff. Received instructions for my participation in the procedure from the performing physician.  

## 2022-03-11 NOTE — Progress Notes (Signed)
Updated medical record.

## 2022-03-11 NOTE — Progress Notes (Signed)
GASTROENTEROLOGY PROCEDURE H&P NOTE   Primary Care Physician: Michael Boston, MD    Reason for Procedure:  Epigastric pain colon cancer screening  Plan:    EGD and colonoscopy  Patient is appropriate for endoscopic procedure(s) in the ambulatory ([facility]) setting.  The nature of the procedure, as well as the risks, benefits, and alternatives were carefully and thoroughly reviewed with the patient. Ample time for discussion and questions allowed. The patient understood, was satisfied, and agreed to proceed.     HPI: Samantha Hancock is a 46 y.o. female who presents for EGD and colonoscopy.  Medical history as below.  Tolerated the prep.  No recent chest pain or shortness of breath.  No abdominal pain today.  Past Medical History:  Diagnosis Date   Anxiety    Chronic headaches    Factor V Leiden ([place])    H/O blood clots    ausing miscariages   HLD (hyperlipidemia)    Hypothyroidism    Palpitations     Past Surgical History:  Procedure Laterality Date   COLONOSCOPY WITH ESOPHAGOGASTRODUODENOSCOPY (EGD)  03/11/2022   DILATION AND CURETTAGE OF UTERUS     LEG SURGERY     left leg    Prior to Admission medications   Medication Sig Start Date End Date Taking? Authorizing Provider  levothyroxine (SYNTHROID) 100 MCG tablet Take 100 mcg by mouth daily. 09/26/18  Yes [provider]  MAGNESIUM PO Take 1-2 tablets by mouth at bedtime. Gummies   Yes [provider]  Multiple Vitamin (MULTIVITAMIN) tablet Take 1 tablet by mouth daily.   Yes [provider]  OVER THE COUNTER MEDICATION Vita mineral powder greens. Green super foods   Yes [provider]  OVER THE COUNTER MEDICATION Cologen powder   Yes [provider]  OVER THE COUNTER MEDICATION Vitamin d3 gummies   Yes [provider]  pantoprazole (PROTONIX) 40 MG tablet Take 40 mg by mouth daily. 12/29/21  Yes [provider]  AMBULATORY NON FORMULARY MEDICATION GABA 500  mg 1 capsule by mouth once daily    [provider]  ondansetron (ZOFRAN ODT) 4 MG disintegrating tablet Take 1 tablet (4 mg total) by mouth every 8 (eight) hours as needed for nausea or vomiting. Patient not taking: Reported on 02/01/2022 12/04/18   Street, [place], PA-C  OVER THE COUNTER MEDICATION Beta Glucan 1 capsule daily    [provider]    Current Outpatient Medications  Medication Sig Dispense Refill   levothyroxine (SYNTHROID) 100 MCG tablet Take 100 mcg by mouth daily.     MAGNESIUM PO Take 1-2 tablets by mouth at bedtime. Gummies     Multiple Vitamin (MULTIVITAMIN) tablet Take 1 tablet by mouth daily.     OVER THE COUNTER MEDICATION Vita mineral powder greens. Green super foods     OVER THE COUNTER MEDICATION Cologen powder     OVER THE COUNTER MEDICATION Vitamin d3 gummies     pantoprazole (PROTONIX) 40 MG tablet Take 40 mg by mouth daily.     AMBULATORY NON FORMULARY MEDICATION GABA 500 mg 1 capsule by mouth once daily     ondansetron (ZOFRAN ODT) 4 MG disintegrating tablet Take 1 tablet (4 mg total) by mouth every 8 (eight) hours as needed for nausea or vomiting. (Patient not taking: Reported on 02/01/2022) 15 tablet 0   OVER THE COUNTER MEDICATION Beta Glucan 1 capsule daily     Current Facility-Administered Medications  Medication Dose Route Frequency Provider Last  Rate Last Admin   0.9 %  sodium chloride infusion  500 mL Intravenous Once Samantha Hancock, Samantha Lines, MD        Allergies as of 03/11/2022 - Review Complete 03/11/2022  Allergen Reaction Noted   Amoxil [amoxicillin] Rash 08/12/2015   Penicillins Other (See Comments) and Rash 11/24/2014    Family History  Problem Relation Age of Onset   Other Mother        factor V Leiden   Pancreatic cancer Mother    Hyperlipidemia Mother    Melanoma Father    Other Sister        Factor V Leiden   Aortic aneurysm Maternal Aunt    Other Maternal Aunt        pancreatic tumor   Aortic aneurysm Maternal  Grandmother    Heart attack Maternal Grandfather    Stroke Paternal Grandmother    Heart attack Paternal Grandfather    Colon cancer Neg Hx    Colon polyps Neg Hx    Esophageal cancer Neg Hx    Stomach cancer Neg Hx    Rectal cancer Neg Hx     Social History   Socioeconomic History   Marital status: Married    Spouse name: Not on file   Number of children: 2   Years of education: Not on file   Highest education level: Not on file  Occupational History   Occupation: Tax inspector  Tobacco Use   Smoking status: Never   Smokeless tobacco: Never  Vaping Use   Vaping Use: Never used  Substance and Sexual Activity   Alcohol use: Yes    Comment: 3 per month   Drug use: Never   Sexual activity: Not Currently  Other Topics Concern   Not on file  Social History Narrative   Not on file   Social Determinants of Health   Financial Resource Strain: Not on file  Food Insecurity: Not on file  Transportation Needs: Not on file  Physical Activity: Not on file  Stress: Not on file  Social Connections: Not on file  Intimate Partner Violence: Not on file    Physical Exam: Vital signs in last 24 hours: '@BP'$  126/73   Pulse 94   Temp 98.2 F (36.8 C) (Temporal)   Resp 18   Ht '5\' 7"'$  (1.702 m)   Wt 154 lb (69.9 kg)   SpO2 100%   BMI 24.12 kg/m  GEN: NAD EYE: Sclerae anicteric ENT: MMM CV: Non-tachycardic Pulm: CTA b/l GI: Soft, NT/ND NEURO:  Alert & Oriented x 3   Zenovia Jarred, MD [facility]  03/11/2022 2:10 PM

## 2022-03-11 NOTE — Progress Notes (Signed)
Acute jaw pain and TMJ after yawning during procedure Jaw now functioning normally but with soreness Brief hydrocodone 1-2 every 6 hours as needed ENT consultation recommended

## 2022-03-11 NOTE — Progress Notes (Signed)
After egd, scope pulled out and bite block comes out easily with the scope.  Pt spun around to me and pts moth still open.  I gently tried to close and felt resistance.  Dr Mamie Nick notified and decision to continue with colon and see if it fixes itself.  Pt did have big propofol yawn. Before procedure noticed but with bite block in, nothing obvious was noted

## 2022-03-12 ENCOUNTER — Telehealth: Payer: Self-pay

## 2022-03-12 NOTE — Telephone Encounter (Signed)
  Follow up Call-     03/11/2022   12:58 PM  Call back number  Post procedure Call Back phone  # [phone]  Permission to leave phone message Yes     Patient questions:  Do you have a fever, pain , or abdominal swelling? No. Pain Score  0 *  Have you tolerated food without any problems? Yes.    Have you been able to return to your normal activities? Yes.    Do you have any questions about your discharge instructions: Diet   No. Medications  No. Follow up visit  No.  Do you have questions or concerns about your Care? No.  Actions: * If pain score is 4 or above: No action needed, pain <4.

## 2022-03-22 ENCOUNTER — Encounter: Payer: Self-pay | Admitting: Internal Medicine

## 2022-03-25 ENCOUNTER — Encounter: Payer: Self-pay | Admitting: Internal Medicine

## 2022-03-25 ENCOUNTER — Other Ambulatory Visit: Payer: Self-pay

## 2022-03-25 DIAGNOSIS — R1013 Epigastric pain: Secondary | ICD-10-CM

## 2022-03-27 ENCOUNTER — Ambulatory Visit (HOSPITAL_BASED_OUTPATIENT_CLINIC_OR_DEPARTMENT_OTHER)
Admission: RE | Admit: 2022-03-27 | Discharge: 2022-03-27 | Disposition: A | Discharge status: Home / Self Care | Payer: BC Managed Care – PPO | Source: Ambulatory Visit | Attending: Internal Medicine | Admitting: Internal Medicine

## 2022-03-27 DIAGNOSIS — R109 Unspecified abdominal pain: Secondary | ICD-10-CM | POA: Diagnosis not present

## 2022-03-27 DIAGNOSIS — R1013 Epigastric pain: Secondary | ICD-10-CM | POA: Diagnosis present

## 2022-05-14 DIAGNOSIS — L811 Chloasma: Secondary | ICD-10-CM | POA: Diagnosis not present

## 2022-05-14 DIAGNOSIS — D225 Melanocytic nevi of trunk: Secondary | ICD-10-CM | POA: Diagnosis not present

## 2022-05-14 DIAGNOSIS — L814 Other melanin hyperpigmentation: Secondary | ICD-10-CM | POA: Diagnosis not present

## 2022-05-14 DIAGNOSIS — D2261 Melanocytic nevi of right upper limb, including shoulder: Secondary | ICD-10-CM | POA: Diagnosis not present

## 2022-07-05 DIAGNOSIS — Z6823 Body mass index (BMI) 23.0-23.9, adult: Secondary | ICD-10-CM | POA: Diagnosis not present

## 2022-07-05 DIAGNOSIS — J029 Acute pharyngitis, unspecified: Secondary | ICD-10-CM | POA: Diagnosis not present

## 2022-09-10 DIAGNOSIS — Z1231 Encounter for screening mammogram for malignant neoplasm of breast: Secondary | ICD-10-CM | POA: Diagnosis not present

## 2022-09-10 DIAGNOSIS — Z01419 Encounter for gynecological examination (general) (routine) without abnormal findings: Secondary | ICD-10-CM | POA: Diagnosis not present

## 2022-10-22 DIAGNOSIS — G43719 Chronic migraine without aura, intractable, without status migrainosus: Secondary | ICD-10-CM | POA: Diagnosis not present

## 2022-10-22 DIAGNOSIS — Z79899 Other long term (current) drug therapy: Secondary | ICD-10-CM | POA: Diagnosis not present

## 2022-10-22 DIAGNOSIS — Z049 Encounter for examination and observation for unspecified reason: Secondary | ICD-10-CM | POA: Diagnosis not present

## 2022-11-19 DIAGNOSIS — G43719 Chronic migraine without aura, intractable, without status migrainosus: Secondary | ICD-10-CM | POA: Diagnosis not present

## 2022-11-26 DIAGNOSIS — H2513 Age-related nuclear cataract, bilateral: Secondary | ICD-10-CM | POA: Diagnosis not present

## 2022-12-23 DIAGNOSIS — G43719 Chronic migraine without aura, intractable, without status migrainosus: Secondary | ICD-10-CM | POA: Diagnosis not present

## 2022-12-23 DIAGNOSIS — M542 Cervicalgia: Secondary | ICD-10-CM | POA: Diagnosis not present

## 2023-01-06 DIAGNOSIS — R739 Hyperglycemia, unspecified: Secondary | ICD-10-CM | POA: Diagnosis not present

## 2023-01-06 DIAGNOSIS — E039 Hypothyroidism, unspecified: Secondary | ICD-10-CM | POA: Diagnosis not present

## 2023-01-10 DIAGNOSIS — Z1331 Encounter for screening for depression: Secondary | ICD-10-CM | POA: Diagnosis not present

## 2023-01-10 DIAGNOSIS — M542 Cervicalgia: Secondary | ICD-10-CM | POA: Diagnosis not present

## 2023-01-10 DIAGNOSIS — G43719 Chronic migraine without aura, intractable, without status migrainosus: Secondary | ICD-10-CM | POA: Diagnosis not present

## 2023-01-10 DIAGNOSIS — Z0000 Encounter for general adult medical examination without abnormal findings: Secondary | ICD-10-CM | POA: Diagnosis not present

## 2023-01-10 DIAGNOSIS — Z1339 Encounter for screening examination for other mental health and behavioral disorders: Secondary | ICD-10-CM | POA: Diagnosis not present

## 2023-01-29 DIAGNOSIS — M542 Cervicalgia: Secondary | ICD-10-CM | POA: Diagnosis not present

## 2023-01-29 DIAGNOSIS — G43719 Chronic migraine without aura, intractable, without status migrainosus: Secondary | ICD-10-CM | POA: Diagnosis not present

## 2023-02-14 DIAGNOSIS — M542 Cervicalgia: Secondary | ICD-10-CM | POA: Diagnosis not present

## 2023-02-14 DIAGNOSIS — G43719 Chronic migraine without aura, intractable, without status migrainosus: Secondary | ICD-10-CM | POA: Diagnosis not present

## 2023-02-28 DIAGNOSIS — G43719 Chronic migraine without aura, intractable, without status migrainosus: Secondary | ICD-10-CM | POA: Diagnosis not present

## 2023-03-18 DIAGNOSIS — M542 Cervicalgia: Secondary | ICD-10-CM | POA: Diagnosis not present

## 2023-03-18 DIAGNOSIS — G43719 Chronic migraine without aura, intractable, without status migrainosus: Secondary | ICD-10-CM | POA: Diagnosis not present

## 2023-07-28 DIAGNOSIS — D2261 Melanocytic nevi of right upper limb, including shoulder: Secondary | ICD-10-CM | POA: Diagnosis not present

## 2023-07-28 DIAGNOSIS — L814 Other melanin hyperpigmentation: Secondary | ICD-10-CM | POA: Diagnosis not present

## 2023-07-28 DIAGNOSIS — D2262 Melanocytic nevi of left upper limb, including shoulder: Secondary | ICD-10-CM | POA: Diagnosis not present

## 2023-07-28 DIAGNOSIS — L821 Other seborrheic keratosis: Secondary | ICD-10-CM | POA: Diagnosis not present

## 2023-09-11 DIAGNOSIS — G43719 Chronic migraine without aura, intractable, without status migrainosus: Secondary | ICD-10-CM | POA: Diagnosis not present

## 2023-09-12 DIAGNOSIS — Z1231 Encounter for screening mammogram for malignant neoplasm of breast: Secondary | ICD-10-CM | POA: Diagnosis not present

## 2023-09-25 DIAGNOSIS — E039 Hypothyroidism, unspecified: Secondary | ICD-10-CM | POA: Diagnosis not present

## 2023-09-25 DIAGNOSIS — Z01419 Encounter for gynecological examination (general) (routine) without abnormal findings: Secondary | ICD-10-CM | POA: Diagnosis not present

## 2023-12-02 DIAGNOSIS — H2513 Age-related nuclear cataract, bilateral: Secondary | ICD-10-CM | POA: Diagnosis not present

## 2023-12-02 DIAGNOSIS — H5213 Myopia, bilateral: Secondary | ICD-10-CM | POA: Diagnosis not present

## 2023-12-02 DIAGNOSIS — H524 Presbyopia: Secondary | ICD-10-CM | POA: Diagnosis not present

## 2023-12-02 DIAGNOSIS — H52221 Regular astigmatism, right eye: Secondary | ICD-10-CM | POA: Diagnosis not present

## 2023-12-02 DIAGNOSIS — H04123 Dry eye syndrome of bilateral lacrimal glands: Secondary | ICD-10-CM | POA: Diagnosis not present

## 2023-12-04 DIAGNOSIS — L282 Other prurigo: Secondary | ICD-10-CM | POA: Diagnosis not present

## 2023-12-04 DIAGNOSIS — L237 Allergic contact dermatitis due to plants, except food: Secondary | ICD-10-CM | POA: Diagnosis not present

## 2024-01-22 DIAGNOSIS — R739 Hyperglycemia, unspecified: Secondary | ICD-10-CM | POA: Diagnosis not present

## 2024-01-22 DIAGNOSIS — Z1331 Encounter for screening for depression: Secondary | ICD-10-CM | POA: Diagnosis not present

## 2024-01-22 DIAGNOSIS — Z1339 Encounter for screening examination for other mental health and behavioral disorders: Secondary | ICD-10-CM | POA: Diagnosis not present

## 2024-01-22 DIAGNOSIS — E039 Hypothyroidism, unspecified: Secondary | ICD-10-CM | POA: Diagnosis not present

## 2024-01-22 DIAGNOSIS — Z23 Encounter for immunization: Secondary | ICD-10-CM | POA: Diagnosis not present

## 2024-01-22 DIAGNOSIS — Z0000 Encounter for general adult medical examination without abnormal findings: Secondary | ICD-10-CM | POA: Diagnosis not present

## 2024-03-05 DIAGNOSIS — E039 Hypothyroidism, unspecified: Secondary | ICD-10-CM | POA: Diagnosis not present

## 2024-03-06 ENCOUNTER — Other Ambulatory Visit: Payer: Self-pay | Admitting: Medical Genetics

## 2024-03-18 ENCOUNTER — Other Ambulatory Visit

## 2024-03-18 DIAGNOSIS — Z006 Encounter for examination for normal comparison and control in clinical research program: Secondary | ICD-10-CM

## 2024-03-26 LAB — GENECONNECT MOLECULAR SCREEN: Genetic Analysis Overall Interpretation: NEGATIVE

## 2024-04-14 DIAGNOSIS — G43719 Chronic migraine without aura, intractable, without status migrainosus: Secondary | ICD-10-CM | POA: Diagnosis not present
